# Patient Record
Sex: Female | Born: 1965 | Race: White | Hispanic: No | Marital: Married | Smoking: Never smoker
Health system: Southern US, Community
[De-identification: ages and names within clinical notes are randomized; demographics above are authoritative.]

## PROBLEM LIST (undated history)

## (undated) DIAGNOSIS — E119 Type 2 diabetes mellitus without complications: Secondary | ICD-10-CM

## (undated) DIAGNOSIS — D649 Anemia, unspecified: Secondary | ICD-10-CM

## (undated) DIAGNOSIS — T4145XA Adverse effect of unspecified anesthetic, initial encounter: Secondary | ICD-10-CM

## (undated) DIAGNOSIS — E785 Hyperlipidemia, unspecified: Secondary | ICD-10-CM

## (undated) DIAGNOSIS — C4491 Basal cell carcinoma of skin, unspecified: Secondary | ICD-10-CM

## (undated) DIAGNOSIS — K219 Gastro-esophageal reflux disease without esophagitis: Secondary | ICD-10-CM

## (undated) DIAGNOSIS — T8859XA Other complications of anesthesia, initial encounter: Secondary | ICD-10-CM

## (undated) HISTORY — DX: Hyperlipidemia, unspecified: E78.5

## (undated) HISTORY — PX: ROTATOR CUFF REPAIR: SHX139

## (undated) HISTORY — PX: CHOLECYSTECTOMY: SHX55

## (undated) HISTORY — DX: Type 2 diabetes mellitus without complications: E11.9

## (undated) HISTORY — DX: Basal cell carcinoma of skin, unspecified: C44.91

---

## 1999-03-16 ENCOUNTER — Other Ambulatory Visit: Admission: RE | Admit: 1999-03-16 | Discharge: 1999-03-16 | Payer: Self-pay | Admitting: Surgery

## 2001-01-16 ENCOUNTER — Other Ambulatory Visit: Admission: RE | Admit: 2001-01-16 | Discharge: 2001-01-16 | Payer: Self-pay | Admitting: *Deleted

## 2001-01-16 ENCOUNTER — Encounter (INDEPENDENT_AMBULATORY_CARE_PROVIDER_SITE_OTHER): Payer: Self-pay | Admitting: Specialist

## 2001-01-20 ENCOUNTER — Ambulatory Visit (HOSPITAL_COMMUNITY): Admission: RE | Admit: 2001-01-20 | Discharge: 2001-01-20 | Payer: Self-pay | Admitting: *Deleted

## 2001-01-20 ENCOUNTER — Inpatient Hospital Stay (HOSPITAL_COMMUNITY): Admission: EM | Admit: 2001-01-20 | Discharge: 2001-01-23 | Payer: Self-pay | Admitting: Internal Medicine

## 2001-01-21 ENCOUNTER — Encounter: Payer: Self-pay | Admitting: *Deleted

## 2001-02-20 ENCOUNTER — Ambulatory Visit (HOSPITAL_COMMUNITY): Admission: RE | Admit: 2001-02-20 | Discharge: 2001-02-20 | Payer: Self-pay | Admitting: *Deleted

## 2001-04-14 ENCOUNTER — Encounter: Payer: Self-pay | Admitting: Internal Medicine

## 2001-04-14 ENCOUNTER — Encounter: Admission: RE | Admit: 2001-04-14 | Discharge: 2001-04-14 | Payer: Self-pay | Admitting: Internal Medicine

## 2001-06-08 ENCOUNTER — Other Ambulatory Visit: Admission: RE | Admit: 2001-06-08 | Discharge: 2001-06-08 | Payer: Self-pay

## 2001-08-28 ENCOUNTER — Encounter: Admission: RE | Admit: 2001-08-28 | Discharge: 2001-08-28 | Payer: Self-pay

## 2001-09-25 ENCOUNTER — Other Ambulatory Visit: Admission: RE | Admit: 2001-09-25 | Discharge: 2001-09-25 | Payer: Self-pay | Admitting: Surgery

## 2002-04-09 ENCOUNTER — Encounter: Payer: Self-pay | Admitting: *Deleted

## 2002-04-09 ENCOUNTER — Encounter: Admission: RE | Admit: 2002-04-09 | Discharge: 2002-04-09 | Payer: Self-pay | Admitting: *Deleted

## 2002-11-15 ENCOUNTER — Other Ambulatory Visit: Admission: RE | Admit: 2002-11-15 | Discharge: 2002-11-15 | Payer: Self-pay | Admitting: Obstetrics and Gynecology

## 2003-11-29 ENCOUNTER — Other Ambulatory Visit: Admission: RE | Admit: 2003-11-29 | Discharge: 2003-11-29 | Payer: Self-pay | Admitting: Obstetrics and Gynecology

## 2004-12-10 ENCOUNTER — Other Ambulatory Visit: Admission: RE | Admit: 2004-12-10 | Discharge: 2004-12-10 | Payer: Self-pay | Admitting: Obstetrics and Gynecology

## 2005-01-15 ENCOUNTER — Ambulatory Visit: Payer: Self-pay | Admitting: Pulmonary Disease

## 2005-02-11 ENCOUNTER — Ambulatory Visit (HOSPITAL_BASED_OUTPATIENT_CLINIC_OR_DEPARTMENT_OTHER): Admission: RE | Admit: 2005-02-11 | Discharge: 2005-02-11 | Payer: Self-pay | Admitting: Pulmonary Disease

## 2005-02-20 ENCOUNTER — Encounter: Payer: Self-pay | Admitting: Gastroenterology

## 2005-02-21 ENCOUNTER — Ambulatory Visit: Payer: Self-pay | Admitting: Pulmonary Disease

## 2005-02-28 ENCOUNTER — Ambulatory Visit: Payer: Self-pay | Admitting: Pulmonary Disease

## 2005-03-15 ENCOUNTER — Other Ambulatory Visit: Admission: RE | Admit: 2005-03-15 | Discharge: 2005-03-15 | Payer: Self-pay | Admitting: Obstetrics and Gynecology

## 2008-04-15 ENCOUNTER — Encounter: Payer: Self-pay | Admitting: Gastroenterology

## 2008-11-23 ENCOUNTER — Ambulatory Visit (HOSPITAL_COMMUNITY): Admission: RE | Admit: 2008-11-23 | Discharge: 2008-11-23 | Payer: Self-pay | Admitting: Otolaryngology

## 2009-05-02 ENCOUNTER — Telehealth: Payer: Self-pay | Admitting: Gastroenterology

## 2009-05-03 ENCOUNTER — Ambulatory Visit: Payer: Self-pay | Admitting: Gastroenterology

## 2009-05-03 DIAGNOSIS — K219 Gastro-esophageal reflux disease without esophagitis: Secondary | ICD-10-CM

## 2009-05-03 DIAGNOSIS — R1310 Dysphagia, unspecified: Secondary | ICD-10-CM

## 2009-05-05 ENCOUNTER — Ambulatory Visit: Payer: Self-pay | Admitting: Gastroenterology

## 2009-06-19 ENCOUNTER — Ambulatory Visit: Payer: Self-pay | Admitting: Gastroenterology

## 2010-11-21 LAB — CBC
HCT: 38.5 % (ref 36.0–46.0)
MCHC: 35.3 g/dL (ref 30.0–36.0)
MCV: 87.5 fL (ref 78.0–100.0)
Platelets: 365 10*3/uL (ref 150–400)
WBC: 8.6 10*3/uL (ref 4.0–10.5)

## 2010-12-25 NOTE — Op Note (Signed)
[name], [name]             ACCOUNT NO.:  [id number]   MEDICAL RECORD NO.:  [id number]          PATIENT TYPE:  AMB   LOCATION:  SDS                          FACILITY:  [facility]   PHYSICIAN:  Onalee Hua L. Annalee Genta, M.D.DATE OF BIRTH:  Sep 29, 1965   DATE OF PROCEDURE:  11/23/2008  DATE OF DISCHARGE:  11/23/2008                               OPERATIVE REPORT   PREOPERATIVE DIAGNOSES:  1. Nasal airway obstruction.  2. Deviated nasal septum.  3. Inferior turbinate hypertrophy.  4. History of anesthesia complications.   POSTOPERATIVE DIAGNOSES:  1. Nasal airway obstruction.  2. Deviated nasal septum.  3. Inferior turbinate hypertrophy.  4. History of anesthesia complications.   INDICATIONS FOR SURGERY:  1. Nasal airway obstruction.  2. Deviated nasal septum.  3. Inferior turbinate hypertrophy.  4. History of anesthesia complications.   SURGICAL PROCEDURE:  1. Nasal septoplasty.  2. Bilateral inferior turbinate reduction.   ANESTHESIA:  General endotracheal.   COMPLICATIONS:  None.   BLOOD LOSS:  Less than 50 mL.   The patient transferred from the operating room to recovery room in  stable condition.   BRIEF HISTORY:  The patient is a 45 year old white female who is  referred to our office for evaluation of progressive symptoms of nasal  airway obstruction.  She had undergone prior sleep study which showed no  evidence of sleep apnea.  The patient complained of chronic and  progressive nasal airway congestion and difficulty with nighttime and  nasal airway breathing.  Given her history and physical examination  which showed severely deviated septum with septal spurring and turbinate  hypertrophy and failure to respond to appropriate medical therapy, I  recommended the above surgical procedures.  The patient has had a  history of anesthesia wake-up difficulties when she underwent a  cholecystectomy several years ago.  Based on her history and  examination, I recommended  that we perform the surgery at [facility] OR with possible overnight observation if necessary.  The  risks, benefits, and possible complications of surgical procedure were  discussed in detail with the patient who understood and concurred with  our plan for surgery which is scheduled at [facility] on November 23, 2008.   PROCEDURE:  The patient was brought to the operating room at [facility] on November 23, 2008 and placed in supine position on the  operating table.  General endotracheal anesthesia was established  without difficulty.  When the patient was adequately anesthetized, she  was positioned on the operating table and prepped and draped in sterile  fashion.  She was injected with a total of 7 mL of 1% lidocaine with  1:100,000 dilution epinephrine was injected in submucosal fashion along  the nasal septum and inferior turbinates bilaterally.  The patient's  nose was then packed with Afrin-soaked cottonoid pledgets which were  left in place for approximately 10 minutes for vasoconstriction and  hemostasis.  The patient was positioned and prepped and draped and the  procedure was begun.   A right anterior hemitransfixion incision was created.  A  mucoperichondrial flap was elevated  from anterior to posterior on the  right hand side.  The bony cartilaginous junction was crossed in the  midline and mucoperiosteal flap was elevated on the left.  Deviated bone  and cartilage in the mid and posterior aspect of the nasal septum were  then mobilized.  Mid septal cartilage was removed and at the conclusion  of the procedure, it was morselized and returned to the  mucoperichondrial pocket.  Anterior columellar and dorsal cartilage was  not significantly deviated and this was left intact.  The patient had a  very large inferior septal spur with bony lateralization into the left  nasal passageway.  The overlying mucosa was elevated and preserved and  bone was  resected using a 4-mm osteotome.  The patient's nasal septum  was brought to the midline.  The morselized cartilage was returned to  the mucoperichondrial pocket and the mucosal flaps were reapproximated  with a 4-0 gut suture on a Keith needle in horizontal mattress fashion.  Hemitransfixion incision was closed with the same stitch and bilateral  Doyle nasal septal splints were then placed after the application of  Bactroban ointment and these were sutured in position with a 3-0 Ethilon  suture.   Attention was then turned to the patient's inferior turbinates where  bilateral inferior turbinate reduction was performed with cautery set at  12 watts.  Two submucosal passes were made in each inferior turbinate  and when the turbinates had been adequately cauterized, they were then  outfractured.  Small anterior incisions were created in each inferior  turbinate, overlying mucosa was elevated, and a small amount of  turbinate bone was resected.  This allowed better lateralization of the  turbinates and a widely patent nasal cavity.   Nasal cavity was irrigated and suctioned.  Oral cavity was suctioned.  Orogastric tube was passed.  Stomach contents were aspirated.  The  patient was then awakened from anesthetic.  She was extubated and was  transferred from the operating room to the recovery room in stable  condition.  There were no complications, and blood loss was less than 50  mL.     ______________________________  Kinnie Scales. Annalee Genta, M.D.    ______________________________  Kinnie Scales. Annalee Genta, M.D.    DLS/MEDQ  D:  04/54/0981  T:  11/24/2008  Job:  [id number]

## 2010-12-28 NOTE — Discharge Summary (Signed)
[facility]  Patient:    [name], [name]                      MRN: [id number] Adm. Date:  [id number] Disc. Date: [id number] Attending:  Vikki Ports CC:         Roosvelt Harps, M.D.             Julieanne Manson, M.D.                           Discharge Summary  ADMISSION DIAGNOSES:  Abdominal pain status post laparoscopic cholecystectomy.  DISCHARGE DIAGNOSES:  Abdominal pain status post laparoscopic cholecystectomy.  CONDITION ON DISCHARGE:  Good and improved.  CONSULTING PHYSICIAN:  Roosvelt Harps, M.D.  HISTORY OF PRESENT ILLNESS:  The patient is a 45 year old white female status post laparoscopic cholecystectomy.  Was seen in the office with abdominal pain and low grade fever.  Patient underwent laboratory tests as an outpatient which showed a markedly elevated bilirubin.  Patient was brought over to the hospital and prepared for admission.  HOSPITAL COURSE:  Of note, repeat laboratories showed her bilirubin to be within normal limits.  SGOT was 100.  She underwent hepatobiliary scan which was normal.  On hospital day #2 patient continued to have some abdominal pain and nausea and therefore underwent ERCP which showed no evidence of leak or common bile duct stones.  A small stent was left in place.  Patient continued to have increasing nausea over the next 24 hours and some mild periumbilical pain.  She denied any diarrhea.  Temperature remained in the 99 range, however, by hospital day #2 patient was feeling more better, pain was completely resolved.  She was no longer nauseous.  She was advanced to a regular diet which she tolerated well and was discharged later on that afternoon.  DISCHARGE MEDICATIONS:  Vicodin for pain.  FOLLOW-UP:  Two weeks with me in the office. DD:  02/10/01 TD:  02/10/01 Job: [id number]

## 2010-12-28 NOTE — Consult Note (Signed)
[name]  Patient:    [name], [name]                      MRN: [id number] Proc. Date: 01/20/01 Adm. Date:  [id number] Attending:  Vikki Ports CC:         [name], M.D.  Darius Bump, M.D.   Consultation Report  HISTORY OF PRESENT ILLNESS:  [name] is a 45 year old female, whom I am asked to see for abnormal liver function tests and right upper quadrant pain, status post a cholecystectomy four days ago.  She reportedly had gallstones and an uneventful cholecystectomy, although she had difficulty taking a deep breath and right upper quadrant discomfort postoperatively, which did not resolve and in fact has gotten worse.  She also reports that she has been running fevers.  She was seen in the office today by Dr. Luan Pulling. Hemoglobin was 12, white blood count 6.9.  Laboratory tests of liver function were entered erroneously, and initially her total bilirubin was reported to be 8.1, but this was a mistake.  It was the total protein that was 8.1. Reevaluation of her liver tests revealed that her total bilirubin is 1.5.  Her SGOT is 134, SGPT 273.  Preoperatively reportedly her liver function tests were normal.  PAST MEDICAL HISTORY:  Largely noncontributory.  ALLERGIES:  PENICILLIN.  PHYSICAL EXAMINATION:  GENERAL:  She is a well-developed, mildly overweight adult female in mild distress.  VITAL SIGNS:  Temperature is 100.9, blood pressure 117/86, pulse 116, respiratory rate 20.  HEENT:  Eyes are anicteric.  Oropharynx is unremarkable.  CHEST:  Clear.  HEART:  Heart sounds regular rate and rhythm without murmurs.  ABDOMEN:  Soft with hypoactive bowel sounds.  The laparoscopic incision sites are clean and nontender.  There is tenderness in the right upper quadrant to deep palpation.  EXTREMITIES:  Without cyanosis, clubbing, edema, or rash.  IMPRESSION:  A 45 year old female with abnormal liver function  tests and fever and right upper quadrant pain, status post cholecystectomy.  HIDA scan today was reportedly normal.  The most likely diagnoses are:  A biliary leak, cholangitis, or retained common bile duct stone, possibly with mild cholangitis.  PLAN:  The patient will be admitted on the surgical service, hydrated, and given antibiotics and probable ERCT will be performed tomorrow afternoon. Please see the orders. DD:  01/20/01 TD:  01/20/01 Job: [id number]

## 2010-12-28 NOTE — Procedures (Signed)
[name], [name]             ACCOUNT NO.:  [id number]   MEDICAL RECORD NO.:  [id number]          PATIENT TYPE:  OUT   LOCATION:  SLEEP CENTER                 FACILITY:  [facility]   PHYSICIAN:  Marcelyn Bruins, M.D. [facility] DATE OF BIRTH:  [birth year]   DATE OF STUDY:  02/11/2005                              NOCTURNAL POLYSOMNOGRAM   REFERRING PHYSICIAN:  Dr. Marcelyn Bruins.   INDICATION FOR THE STUDY:  Hypersomnia with sleep apnea. Epworth score: 16.   SLEEP ARCHITECTURE:  The patient has a total sleep time of 413 minutes with  greatly decreased REM but adequate slow wave sleep. Sleep onset latency was  mildly prolonged at 38 minutes as was REM onset at 207 minutes.   IMPRESSION:  1.  Small numbers of obstructive events which do not meet the respiratory      disturbance index criteria for the obstructive sleep apnea syndrome.      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 Clinical correlation is suggested.  2.  No clinically significant cardiac arrhythmias.  3.  Large numbers of leg jerks with significant sleep disruption. I suspect      this is the main contributor to the patient's sleep disruption.     ______________________________  Suzzette Righter    KC/MEDQ  D:  02/21/2005 10:35:26  T:  02/21/2005 11:30:14  Job:  [id number]

## 2011-08-15 ENCOUNTER — Emergency Department (HOSPITAL_BASED_OUTPATIENT_CLINIC_OR_DEPARTMENT_OTHER)
Admission: EM | Admit: 2011-08-15 | Discharge: 2011-08-15 | Disposition: A | Discharge status: Home / Self Care | Payer: 59 | Attending: Emergency Medicine | Admitting: Emergency Medicine

## 2011-08-15 ENCOUNTER — Encounter: Payer: Self-pay | Admitting: *Deleted

## 2011-08-15 ENCOUNTER — Emergency Department (INDEPENDENT_AMBULATORY_CARE_PROVIDER_SITE_OTHER): Payer: 59

## 2011-08-15 DIAGNOSIS — R1031 Right lower quadrant pain: Secondary | ICD-10-CM | POA: Insufficient documentation

## 2011-08-15 DIAGNOSIS — K219 Gastro-esophageal reflux disease without esophagitis: Secondary | ICD-10-CM | POA: Insufficient documentation

## 2011-08-15 DIAGNOSIS — Z79899 Other long term (current) drug therapy: Secondary | ICD-10-CM | POA: Insufficient documentation

## 2011-08-15 DIAGNOSIS — J45909 Unspecified asthma, uncomplicated: Secondary | ICD-10-CM | POA: Insufficient documentation

## 2011-08-15 HISTORY — DX: Gastro-esophageal reflux disease without esophagitis: K21.9

## 2011-08-15 LAB — PREGNANCY, URINE: Preg Test, Ur: NEGATIVE

## 2011-08-15 LAB — CBC
HCT: 37.8 % (ref 36.0–46.0)
Hemoglobin: 12.9 g/dL (ref 12.0–15.0)
MCV: 85.1 fL (ref 78.0–100.0)
Platelets: 325 10*3/uL (ref 150–400)
RBC: 4.44 MIL/uL (ref 3.87–5.11)
WBC: 10.2 10*3/uL (ref 4.0–10.5)

## 2011-08-15 LAB — BASIC METABOLIC PANEL
CO2: 27 mEq/L (ref 19–32)
Calcium: 9.6 mg/dL (ref 8.4–10.5)
Glucose, Bld: 95 mg/dL (ref 70–99)
Sodium: 138 mEq/L (ref 135–145)

## 2011-08-15 LAB — URINALYSIS, ROUTINE W REFLEX MICROSCOPIC
Glucose, UA: NEGATIVE mg/dL
Hgb urine dipstick: NEGATIVE
Specific Gravity, Urine: 1.022 (ref 1.005–1.030)
Urobilinogen, UA: 0.2 mg/dL (ref 0.0–1.0)
pH: 6.5 (ref 5.0–8.0)

## 2011-08-15 LAB — HEPATIC FUNCTION PANEL
AST: 18 U/L (ref 0–37)
Albumin: 4.6 g/dL (ref 3.5–5.2)
Alkaline Phosphatase: 56 U/L (ref 39–117)
Total Bilirubin: 0.7 mg/dL (ref 0.3–1.2)

## 2011-08-15 LAB — DIFFERENTIAL
Eosinophils Relative: 1 % (ref 0–5)
Lymphocytes Relative: 20 % (ref 12–46)
Lymphs Abs: 2 10*3/uL (ref 0.7–4.0)

## 2011-08-15 MED ORDER — ONDANSETRON HCL 4 MG/2ML IJ SOLN
4.0000 mg | Freq: Once | INTRAMUSCULAR | Status: AC
  Administered 2011-08-15: 4 mg via INTRAVENOUS
  Filled 2011-08-15: qty 2

## 2011-08-15 MED ORDER — SODIUM CHLORIDE 0.9 % IV BOLUS (SEPSIS)
1000.0000 mL | Freq: Once | INTRAVENOUS | Status: AC
  Administered 2011-08-15: 1000 mL via INTRAVENOUS

## 2011-08-15 MED ORDER — IOHEXOL 300 MG/ML  SOLN
100.0000 mL | Freq: Once | INTRAMUSCULAR | Status: AC | PRN
  Administered 2011-08-15: 100 mL via INTRAVENOUS

## 2011-08-15 NOTE — ED Notes (Signed)
PT AMB TO TRIAGE WITH QUICK STEADY GAIT IN NAD, REPORTS AWAKENING AT 3AM WITH RLQ PAIN, WORSENING THROUGHOUT THE DAY WITH NAUSEA, NO EMESIS OR DIARRHEA, OR FEVERS.

## 2011-08-15 NOTE — ED Notes (Signed)
Pt ambulated to CT

## 2011-08-15 NOTE — ED Provider Notes (Signed)
History     CSN: [id number]  Arrival date & time 08/15/11  1253   First MD Initiated Contact with Patient 08/15/11 1506      Chief Complaint  Patient presents with  . Abdominal Pain    (Consider location/radiation/quality/duration/timing/severity/associated sxs/prior treatment) HPI  45yof chief asthma, GERD presents with abdominal pain. The patient complains of waking up this morning at 3 AM with right lower quadrant pain. She rates as a 5/10 at this time without radiation. The pain is constant and dull but intermittently worsening and sharp. She states it is gradually increased in intensity throughout the day. She complains of nausea but no vomiting. Denies back pain. Denies diarrhea. Denies hematuria/dysuria/freq/urgency. History of cholecystectomy, no other abdominal surgeries.   ED Notes, ED Provider Notes from 08/15/11 0000 to 08/15/11 13:57:25       Amy Theotis Barrio, RN 08/15/2011 13:53      PT AMB TO TRIAGE WITH QUICK STEADY GAIT IN NAD, REPORTS AWAKENING AT 3AM WITH RLQ PAIN, WORSENING THROUGHOUT THE DAY WITH NAUSEA, NO EMESIS OR DIARRHEA, OR FEVERS.    Past Medical History  Diagnosis Date  . Asthma   . GERD (gastroesophageal reflux disease)     History reviewed. No pertinent past surgical history.  History reviewed. No pertinent family history.  History  Substance Use Topics  . Smoking status: Never Smoker   . Smokeless tobacco: Not on file  . Alcohol Use: No    OB History    Grav Para Term Preterm Abortions TAB SAB Ect Mult Living                  Review of Systems  All other systems reviewed and are negative.   except as noted HPI  Allergies  Penicillins  Home Medications   Current Outpatient Rx  Name Route Sig Dispense Refill  . ALBUTEROL SULFATE HFA 108 (90 BASE) MCG/ACT IN AERS Inhalation Inhale 2 puffs into the lungs every 6 (six) hours as needed.      Marland Kitchen FEXOFENADINE HCL 180 MG PO TABS Oral Take 180 mg by mouth daily.      Marland Kitchen FLUTICASONE-SALMETEROL  115-21 MCG/ACT IN AERO Inhalation Inhale 2 puffs into the lungs 2 (two) times daily.      Marland Kitchen OMEPRAZOLE-SODIUM BICARBONATE 40-1100 MG PO CAPS Oral Take 1 capsule by mouth daily before breakfast.        BP 156/90  Pulse 98  Temp(Src) 98.2 F (36.8 C) (Oral)  Resp 18  Ht 5\' 3"  (1.6 m)  Wt 183 lb (83.008 kg)  BMI 32.42 kg/m2  SpO2 100%  LMP 07/29/2011  Physical Exam  Nursing note and vitals reviewed. Constitutional: She is oriented to person, place, and time. She appears well-developed.  HENT:  Head: Atraumatic.  Mouth/Throat: Oropharynx is clear and moist.  Eyes: Conjunctivae and EOM are normal. Pupils are equal, round, and reactive to light.  Neck: Normal range of motion. Neck supple.  Cardiovascular: Normal rate, regular rhythm, normal heart sounds and intact distal pulses.   Pulmonary/Chest: Effort normal and breath sounds normal. No respiratory distress. She has no wheezes. She has no rales.  Abdominal: Soft. She exhibits no distension. There is tenderness. There is no rebound and no guarding.       +RLQ ttp no r/g  Genitourinary:       Bimanual- no CMT  Musculoskeletal: Normal range of motion.  Neurological: She is alert and oriented to person, place, and time.  Skin: Skin is warm and dry.  No rash noted.  Psychiatric: She has a normal mood and affect.    ED Course  Procedures (including critical care time)  Labs Reviewed  URINALYSIS, ROUTINE W REFLEX MICROSCOPIC - Abnormal; Notable for the following:    APPearance CLOUDY (*)    All other components within normal limits  CBC  DIFFERENTIAL  BASIC METABOLIC PANEL  HEPATIC FUNCTION PANEL  PREGNANCY, URINE   Ct Abdomen Pelvis W Contrast  08/15/2011  *RADIOLOGY REPORT*  Clinical Data: Pain.  History of cholecystectomy.  CT ABDOMEN AND PELVIS WITH CONTRAST  Technique:  Multidetector CT imaging of the abdomen and pelvis was performed following the standard protocol during bolus administration of intravenous contrast.   Contrast: OMNIPAQUE IOHEXOL 300 MG/ML IV SOLN  Comparison: None.  Findings: The appendix is well visualized and has a normal appearance without evidence of inflammation or enlargement.  The liver shows mild, diffuse fatty infiltration.  The gallbladder has been removed.  Bile ducts are nondilated.  The pancreas, spleen, bowel, adrenal glands and kidneys are within normal limits.  The bladder is unremarkable.  2.5 cm cyst in the left adnexal region is likely physiologic.  No free fluid or abscess.  No hernias identified.  No abnormal calcifications.  No masses or enlarged lymph nodes identified.  Bony structures are unremarkable.  IMPRESSION: No acute findings.  Normal appendix.  Original Report Authenticated By: Reola Calkins, M.D.     1. Abdominal pain     MDM  Since with right lower quadrant abdominal pain. Patient appears well here. The pain as 5/10 she is declining analgesia at this time. IV fluids, Zofran, labs and urinalysis. CT abdomen and pelvis. Reassess.   CT AP as above negative for appendicitis, colitis or other acute intraabdominal etiology for patient's pain. Labs unremarkable. Will d/c home with PMD f/u. Precautions for return.     Forbes Cellar, MD 08/15/11 1701

## 2012-03-19 ENCOUNTER — Other Ambulatory Visit: Payer: Self-pay | Admitting: Gastroenterology

## 2012-03-20 ENCOUNTER — Other Ambulatory Visit: Payer: Self-pay | Admitting: Gastroenterology

## 2012-03-23 ENCOUNTER — Other Ambulatory Visit: Payer: Self-pay | Admitting: Gastroenterology

## 2012-03-23 MED ORDER — OMEPRAZOLE-SODIUM BICARBONATE 40-1100 MG PO CAPS: 1.0000 | ORAL_CAPSULE | Freq: Every day | ORAL | Status: DC

## 2012-03-23 NOTE — Telephone Encounter (Signed)
[facility]. Patient scheduled an appointment today for September

## 2012-04-08 IMAGING — CT CT ABD-PELV W/ CM
2 of 4 series · 16 of 46 positions shown, 18 images · IV contrast (APPLIED)
Comparison: None.

CLINICAL DATA: Pain.  History of cholecystectomy.

CT ABDOMEN AND PELVIS WITH CONTRAST
TECHNIQUE: Multidetector CT imaging of the abdomen and pelvis was
performed following the standard protocol during bolus
administration of intravenous contrast.
Contrast: 100mL OMNIPAQUE IOHEXOL 300 MG/ML IV SOLN

[Series 2: abd/pelvis 5.0 b31f · axial · 0.74mm/px · z∈[+756,+1146]mm · 13 of 86 slices shown, 15 images]
[im 4/86  soft-tissue]
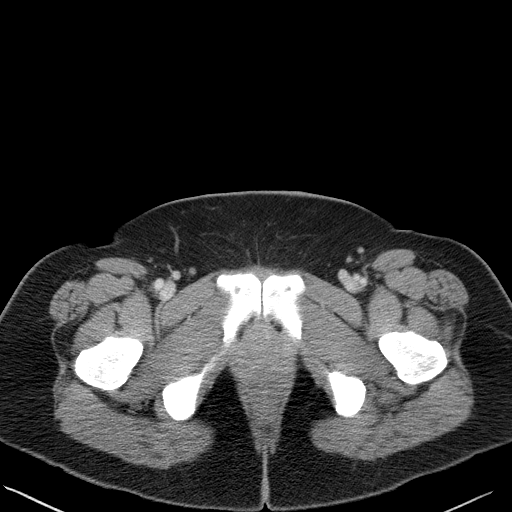
[im 4/86  bone]
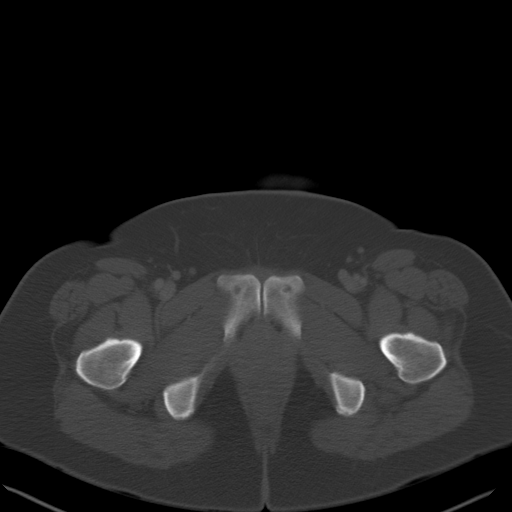
[im 12/86  soft-tissue]
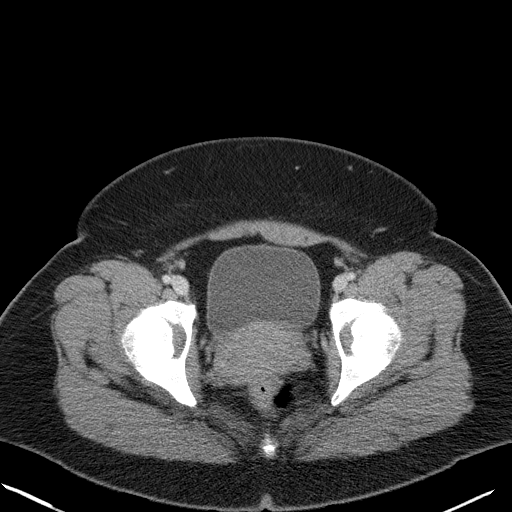
[im 19/86  soft-tissue]
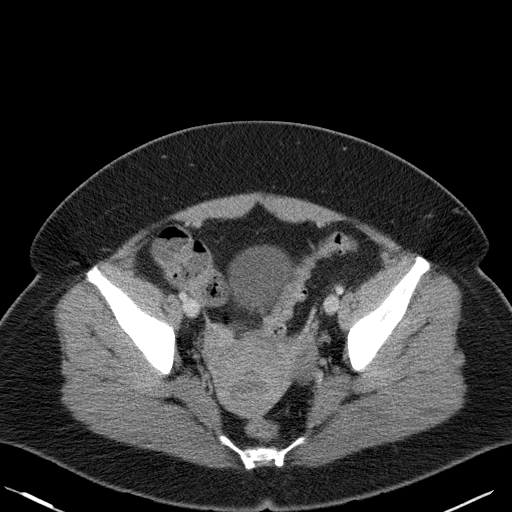
[im 23/86  soft-tissue]
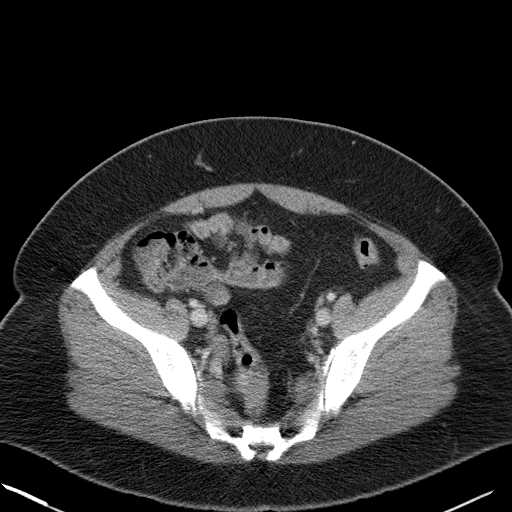
[im 30/86  soft-tissue]
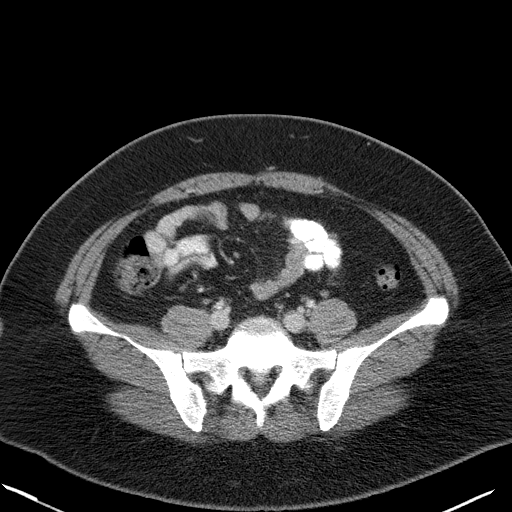
[im 37/86  soft-tissue]
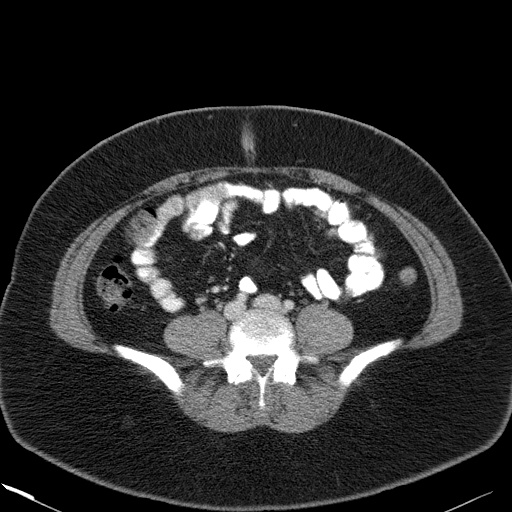
[im 45/86  soft-tissue]
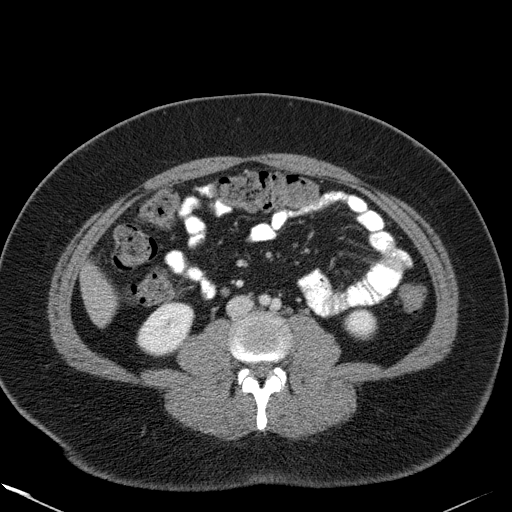
[im 49/86  soft-tissue]
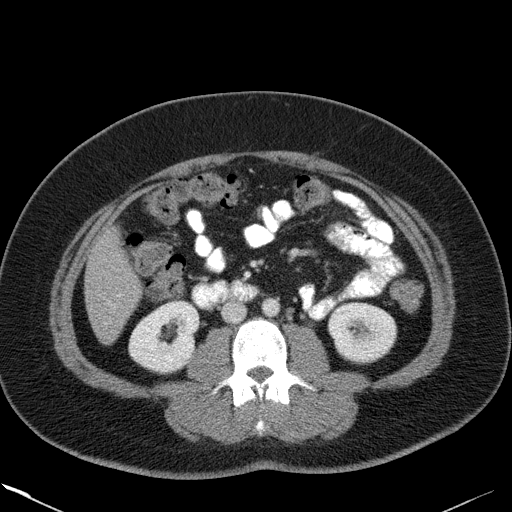
[im 56/86  soft-tissue]
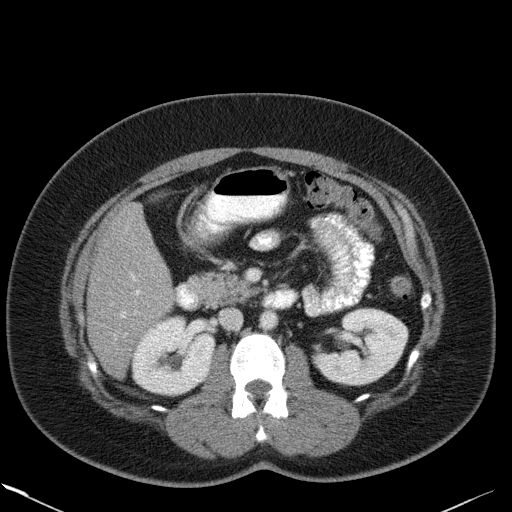
[im 56/86  bone]
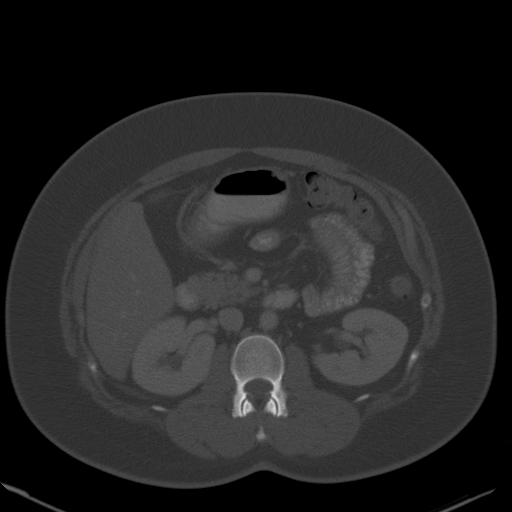
[im 63/86  soft-tissue]
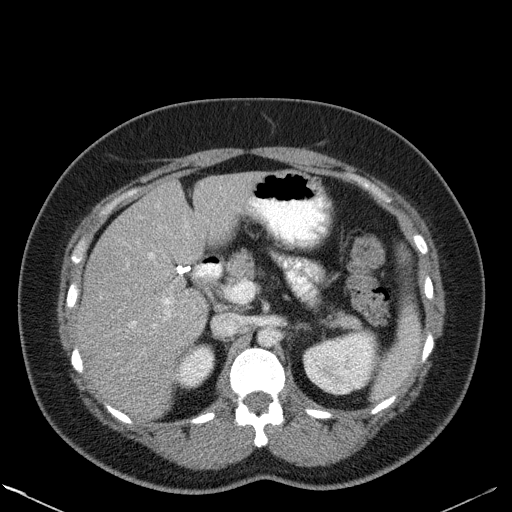
[im 67/86  soft-tissue]
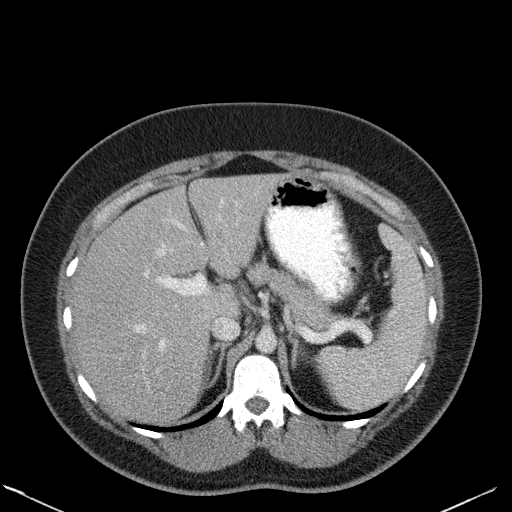
[im 74/86  soft-tissue]
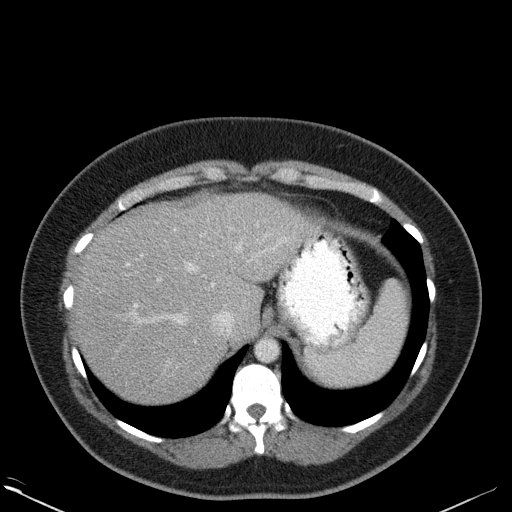
[im 82/86  soft-tissue]
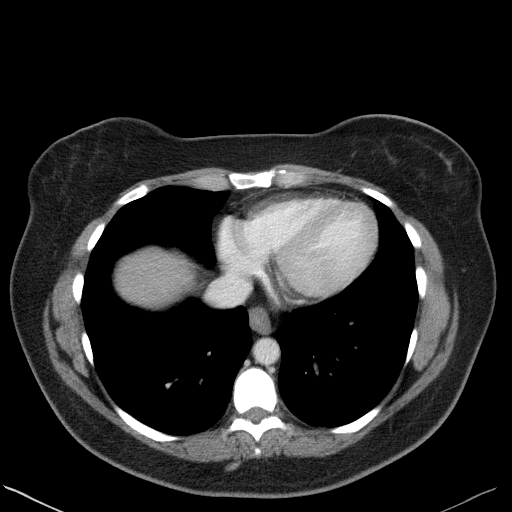

[Series 5: abd/pelvis 3.0 coronal · coronal · 0.66mm/px · 3 of 98 slices shown]
[im 33/98  soft-tissue]
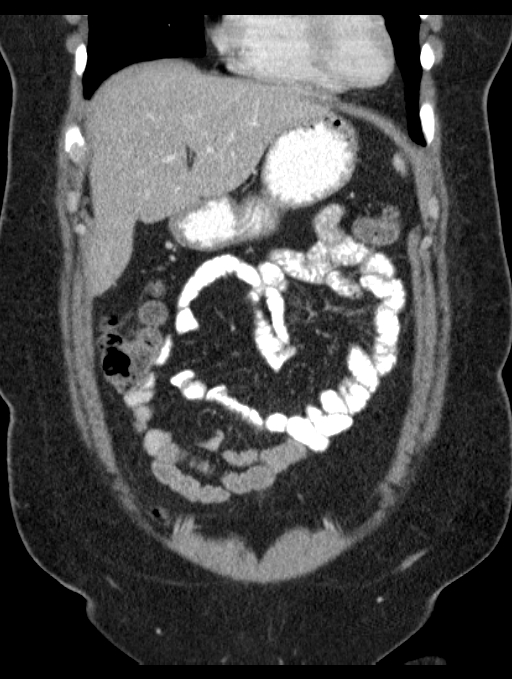
[im 44/98  soft-tissue]
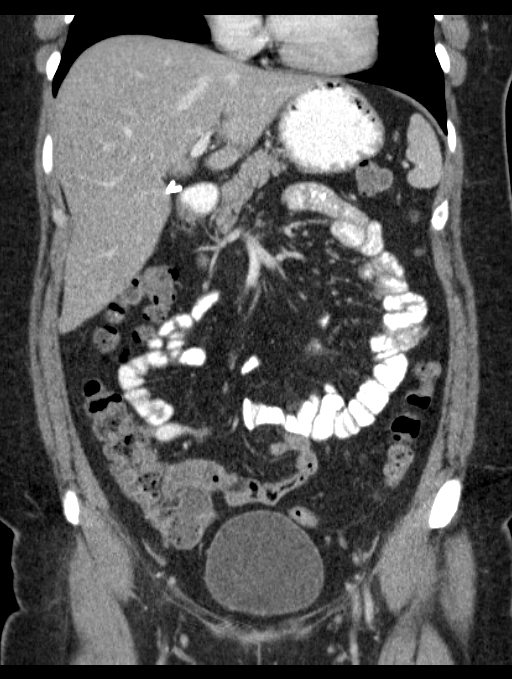
[im 54/98  soft-tissue]
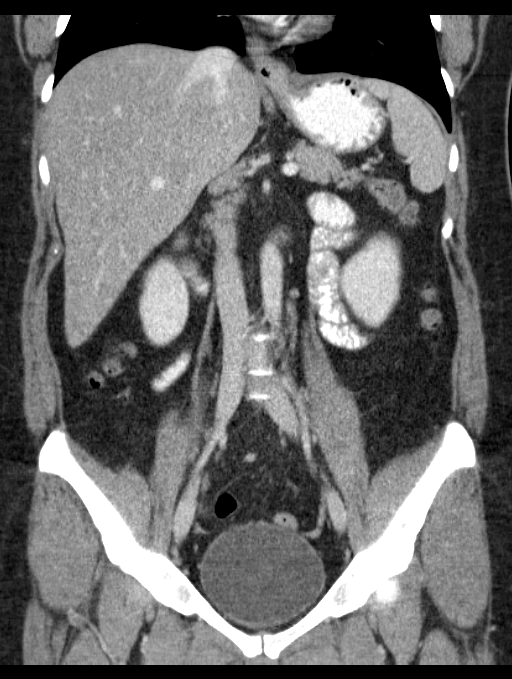

[16 of 46 positions shown; findings below may reference images not displayed]

FINDINGS: The appendix is well visualized and has a normal
appearance without evidence of inflammation or enlargement.  The
liver shows mild, diffuse fatty infiltration.  The gallbladder has
been removed.  Bile ducts are nondilated.

The pancreas, spleen, bowel, adrenal glands and kidneys are within
normal limits.  The bladder is unremarkable.  2.5 cm cyst in the
left adnexal region is likely physiologic.  No free fluid or
abscess.  No hernias identified.  No abnormal calcifications.  No
masses or enlarged lymph nodes identified.  Bony structures are
unremarkable.
IMPRESSION: No acute findings.  Normal appendix.

## 2012-05-04 ENCOUNTER — Ambulatory Visit (INDEPENDENT_AMBULATORY_CARE_PROVIDER_SITE_OTHER): Payer: 59 | Admitting: Gastroenterology

## 2012-05-04 ENCOUNTER — Encounter: Payer: Self-pay | Admitting: Gastroenterology

## 2012-05-04 VITALS — BP 130/70 | HR 70 | Ht 63.0 in | Wt 188.0 lb

## 2012-05-04 DIAGNOSIS — K219 Gastro-esophageal reflux disease without esophagitis: Secondary | ICD-10-CM

## 2012-05-04 DIAGNOSIS — R1031 Right lower quadrant pain: Secondary | ICD-10-CM

## 2012-05-04 NOTE — Progress Notes (Signed)
History of Present Illness: Pleasant 46 year old white female with history of esophageal stricture here for evaluation of abdominal pain and reflux. The past 10 months she has noted intermittent episodes of severe, sharp right and left lower quadrant pain accompanied by urgency and diarrhea. This does not occur with any specific foods that she is aware of. ER evaluation in January, 2013 included a CT that was negative. For the past 2 months, after completely discontinuing use of carbonated beverages, symptoms have entirely subsided. She is without pain, bleeding or diarrhea.  Patient also complains of regurgitation of gastric contents after  bedtime with pyrosis and sore throat. This is despite taking Zegerid at bedtime. She feels her throat is tightening up when she lays down. There has been no change in her diet. She has a history of erosive esophagitis and an esophageal stricture which was dilated in 2010.    Past Medical History  Diagnosis Date  . Asthma   . GERD (gastroesophageal reflux disease)    Past Surgical History  Procedure Date  . Cholecystectomy   . Rotator cuff repair     right   family history includes Diabetes in an unspecified family member and Lung cancer in her mother.  There is no history of Colon cancer. Current Outpatient Prescriptions  Medication Sig Dispense Refill  . albuterol (PROVENTIL HFA;VENTOLIN HFA) 108 (90 BASE) MCG/ACT inhaler Inhale 2 puffs into the lungs every 6 (six) hours as needed.        . fexofenadine (ALLEGRA) 180 MG tablet Take 180 mg by mouth daily.        . fluticasone-salmeterol (ADVAIR HFA) 115-21 MCG/ACT inhaler Inhale 2 puffs into the lungs 2 (two) times daily.        Marland Kitchen omeprazole-sodium bicarbonate (ZEGERID) 40-1100 MG per capsule Take 1 capsule by mouth daily before breakfast.  30 capsule  3   Allergies as of 05/04/2012 - Review Complete 05/04/2012  Allergen Reaction Noted  . Penicillins      reports that she has never smoked. She has  never used smokeless tobacco. She reports that she does not drink alcohol or use illicit drugs.     Review of Systems: Pertinent positive and negative review of systems were noted in the above HPI section. All other review of systems were otherwise negative.  Vital signs were reviewed in today's medical record Physical Exam: General: Well developed , well nourished, no acute distress Head: Normocephalic and atraumatic Eyes:  sclerae anicteric, EOMI Ears: Normal auditory acuity Mouth: No deformity or lesions Neck: Supple, no masses or thyromegaly Lungs: Clear throughout to auscultation Heart: Regular rate and rhythm; no murmurs, rubs or bruits Abdomen: Soft, non tender and non distended. No masses, hepatosplenomegaly or hernias noted. Normal Bowel sounds. There is no succussion splash Rectal:deferred Musculoskeletal: Symmetrical with no gross deformities  Skin: No lesions on visible extremities Pulses:  Normal pulses noted Extremities: No clubbing, cyanosis, edema or deformities noted Neurological: Alert oriented x 4, grossly nonfocal Cervical Nodes:  No significant cervical adenopathy Inguinal Nodes: No significant inguinal adenopathy Psychological:  Alert and cooperative. Normal mood and affect

## 2012-05-04 NOTE — Assessment & Plan Note (Addendum)
Reason flareup of nocturnal GERD symptoms.  Recommendations #1 continue  Zegerid atbedtime #2 add antacids at bedtime; if not improved I would consider adding metoclopramide each bedtime or possibly baclofen

## 2012-05-04 NOTE — Assessment & Plan Note (Signed)
Etiology is not certain but the patient clearly states that symptoms have entirely subsided after stopping ingestion of sodas. I doubt she has significant underlying GI pathology.  Recommendations #1 no GI workup unless symptoms recur

## 2012-05-04 NOTE — Patient Instructions (Addendum)
We will renew your Zegerid  Follow up in 1 year Gastroesophageal Reflux Disease, Adult Gastroesophageal reflux disease (GERD) happens when acid from your stomach flows up into the esophagus. When acid comes in contact with the esophagus, the acid causes soreness (inflammation) in the esophagus. Over time, GERD may create small holes (ulcers) in the lining of the esophagus. CAUSES   Increased body weight. This puts pressure on the stomach, making acid rise from the stomach into the esophagus.   Smoking. This increases acid production in the stomach.   Drinking alcohol. This causes decreased pressure in the lower esophageal sphincter (valve or ring of muscle between the esophagus and stomach), allowing acid from the stomach into the esophagus.   Late evening meals and a full stomach. This increases pressure and acid production in the stomach.   A malformed lower esophageal sphincter.  Sometimes, no cause is found. SYMPTOMS   Burning pain in the lower part of the mid-chest behind the breastbone and in the mid-stomach area. This may occur twice a week or more often.   Trouble swallowing.   Sore throat.   Dry cough.   Asthma-like symptoms including chest tightness, shortness of breath, or wheezing.  DIAGNOSIS  Your caregiver may be able to diagnose GERD based on your symptoms. In some cases, X-rays and other tests may be done to check for complications or to check the condition of your stomach and esophagus. TREATMENT  Your caregiver may recommend over-the-counter or prescription medicines to help decrease acid production. Ask your caregiver before starting or adding any new medicines.  HOME CARE INSTRUCTIONS   Change the factors that you can control. Ask your caregiver for guidance concerning weight loss, quitting smoking, and alcohol consumption.   Avoid foods and drinks that make your symptoms worse, such as:   Caffeine or alcoholic drinks.   Chocolate.   Peppermint or mint  flavorings.   Garlic and onions.   Spicy foods.   Citrus fruits, such as oranges, lemons, or limes.   Tomato-based foods such as sauce, chili, salsa, and pizza.   Fried and fatty foods.   Avoid lying down for the 3 hours prior to your bedtime or prior to taking a nap.   Eat small, frequent meals instead of large meals.   Wear loose-fitting clothing. Do not wear anything tight around your waist that causes pressure on your stomach.   Raise the head of your bed 6 to 8 inches with wood blocks to help you sleep. Extra pillows will not help.   Only take over-the-counter or prescription medicines for pain, discomfort, or fever as directed by your caregiver.   Do not take aspirin, ibuprofen, or other nonsteroidal anti-inflammatory drugs (NSAIDs).  SEEK IMMEDIATE MEDICAL CARE IF:   You have pain in your arms, neck, jaw, teeth, or back.   Your pain increases or changes in intensity or duration.   You develop nausea, vomiting, or sweating (diaphoresis).   You develop shortness of breath, or you faint.   Your vomit is green, yellow, black, or looks like coffee grounds or blood.   Your stool is red, bloody, or black.  These symptoms could be signs of other problems, such as heart disease, gastric bleeding, or esophageal bleeding. MAKE SURE YOU:   Understand these instructions.   Will watch your condition.   Will get help right away if you are not doing well or get worse.  Document Released: 05/08/2005 Document Revised: 07/18/2011 Document Reviewed: 02/15/2011 [facility] Patient Information 2012 [place],  [employer]. 

## 2012-10-07 ENCOUNTER — Other Ambulatory Visit: Payer: Self-pay | Admitting: Internal Medicine

## 2012-10-07 ENCOUNTER — Ambulatory Visit
Admission: RE | Admit: 2012-10-07 | Discharge: 2012-10-07 | Disposition: A | Discharge status: Home / Self Care | Payer: 59 | Source: Ambulatory Visit | Attending: Internal Medicine | Admitting: Internal Medicine

## 2012-10-07 DIAGNOSIS — R221 Localized swelling, mass and lump, neck: Secondary | ICD-10-CM

## 2012-10-12 ENCOUNTER — Other Ambulatory Visit: Payer: 59

## 2012-12-10 ENCOUNTER — Other Ambulatory Visit: Payer: Self-pay | Admitting: Internal Medicine

## 2012-12-10 DIAGNOSIS — E041 Nontoxic single thyroid nodule: Secondary | ICD-10-CM

## 2012-12-29 ENCOUNTER — Ambulatory Visit
Admission: RE | Admit: 2012-12-29 | Discharge: 2012-12-29 | Disposition: A | Discharge status: Home / Self Care | Payer: 59 | Source: Ambulatory Visit | Attending: Internal Medicine | Admitting: Internal Medicine

## 2012-12-29 DIAGNOSIS — E041 Nontoxic single thyroid nodule: Secondary | ICD-10-CM

## 2013-02-23 ENCOUNTER — Other Ambulatory Visit: Payer: Self-pay | Admitting: Gastroenterology

## 2013-03-30 ENCOUNTER — Other Ambulatory Visit (HOSPITAL_COMMUNITY): Payer: Self-pay | Admitting: Family Medicine

## 2013-03-30 DIAGNOSIS — E041 Nontoxic single thyroid nodule: Secondary | ICD-10-CM

## 2013-06-30 ENCOUNTER — Emergency Department (HOSPITAL_COMMUNITY): Admission: EM | Admit: 2013-06-30 | Payer: 59 | Source: Home / Self Care

## 2013-06-30 ENCOUNTER — Ambulatory Visit (HOSPITAL_COMMUNITY)
Admission: RE | Admit: 2013-06-30 | Discharge: 2013-06-30 | Disposition: A | Discharge status: Home / Self Care | Payer: 59 | Source: Ambulatory Visit | Attending: Family Medicine | Admitting: Family Medicine

## 2013-06-30 DIAGNOSIS — E041 Nontoxic single thyroid nodule: Secondary | ICD-10-CM | POA: Insufficient documentation

## 2013-09-24 ENCOUNTER — Other Ambulatory Visit: Payer: Self-pay | Admitting: Gastroenterology

## 2014-02-22 IMAGING — US US SOFT TISSUE HEAD/NECK
1 series · 14 of 25 positions shown · non-contrast
Comparison: 12/29/2012

CLINICAL DATA: Followup thyroid nodule

EXAM:
THYROID ULTRASOUND
TECHNIQUE: Ultrasound examination of the thyroid gland and adjacent soft
tissues was performed.

[Series 1: us soft tissue head/neck · 0.07mm/px · 14 of 29 slices shown]
[im 1/29]
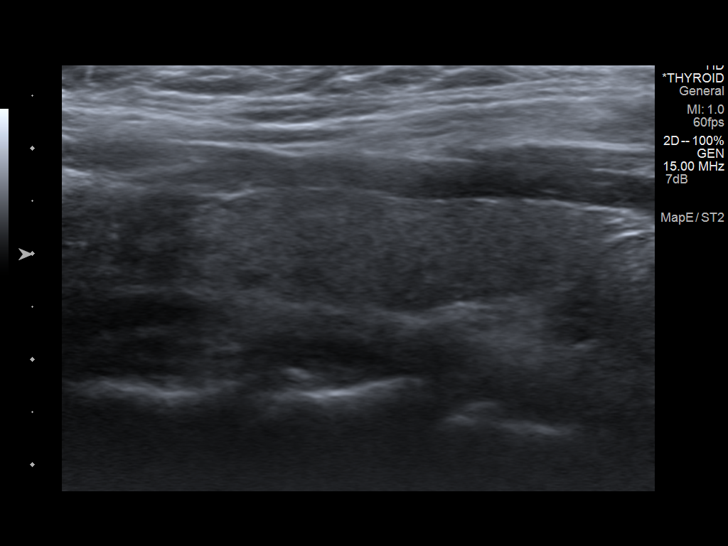
[im 3/29]
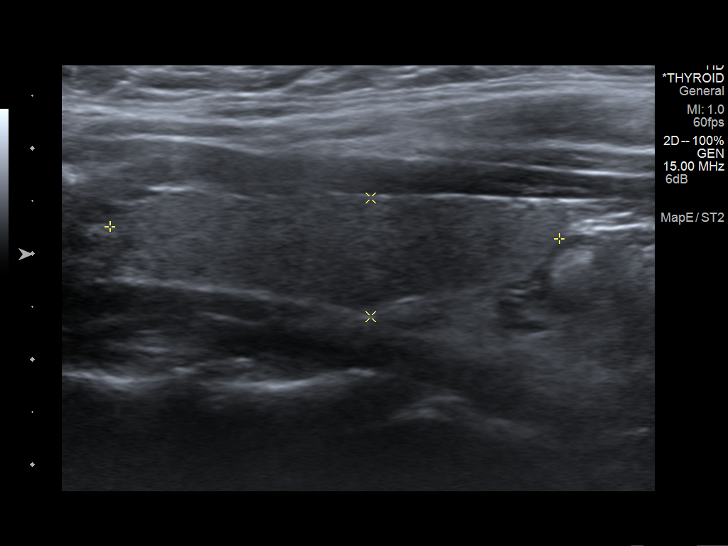
[im 5/29]
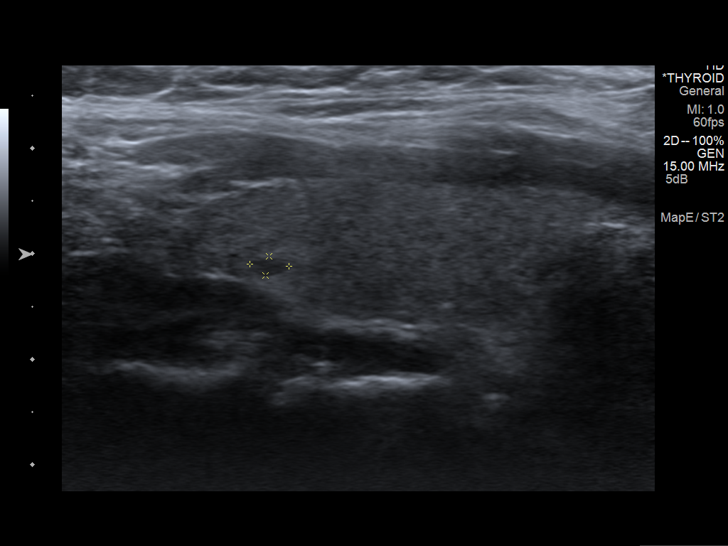
[im 8/29]
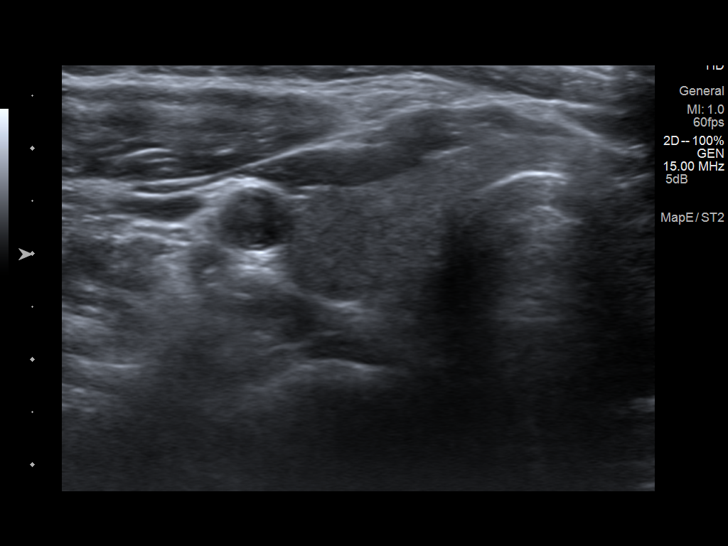
[im 10/29]
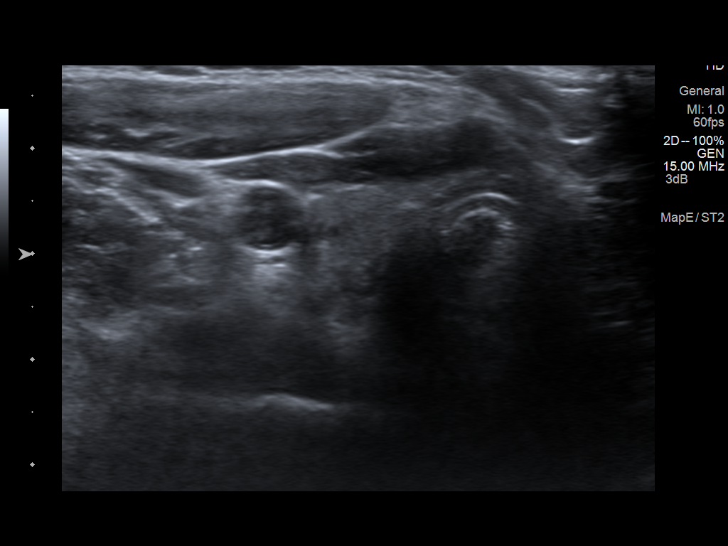
[im 11/29]
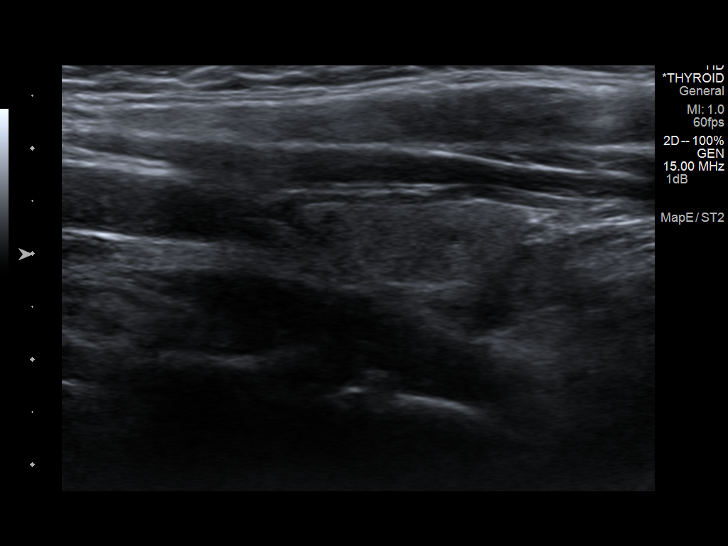
[im 13/29]
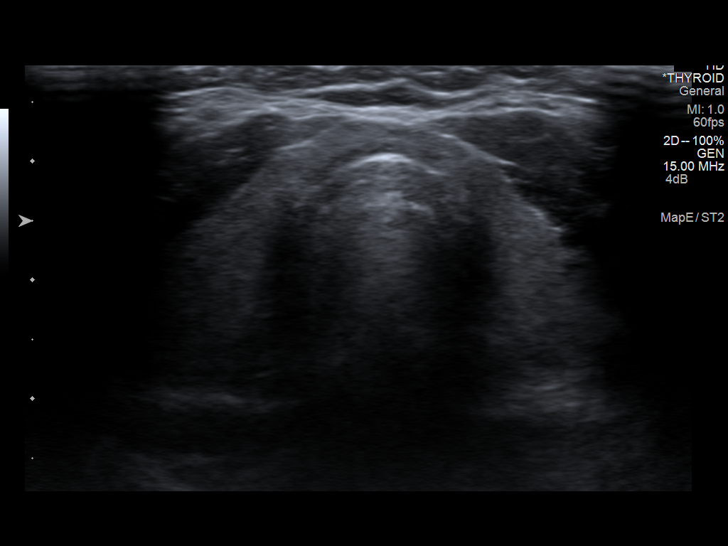
[im 16/29]
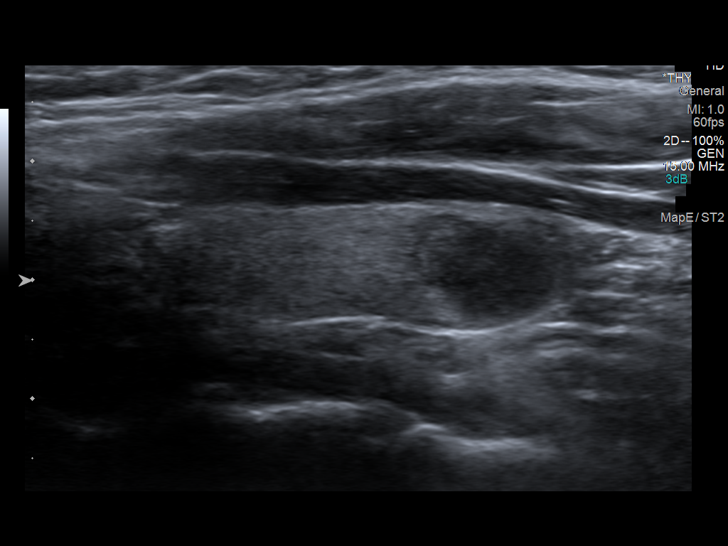
[im 18/29]
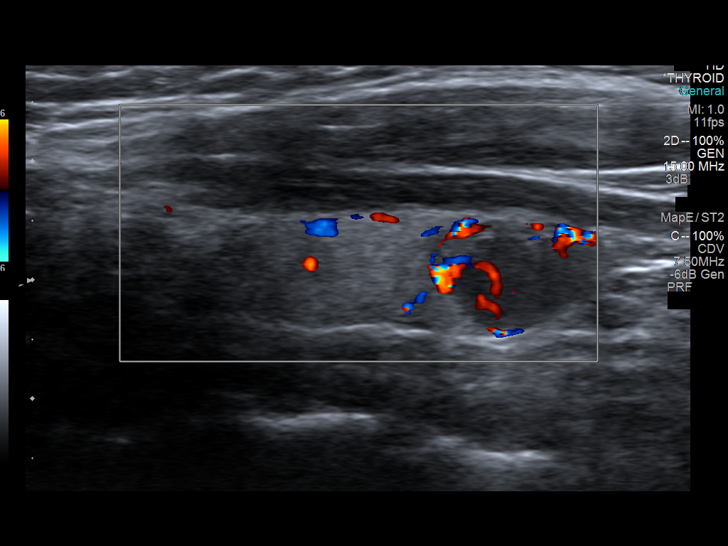
[im 19/29]
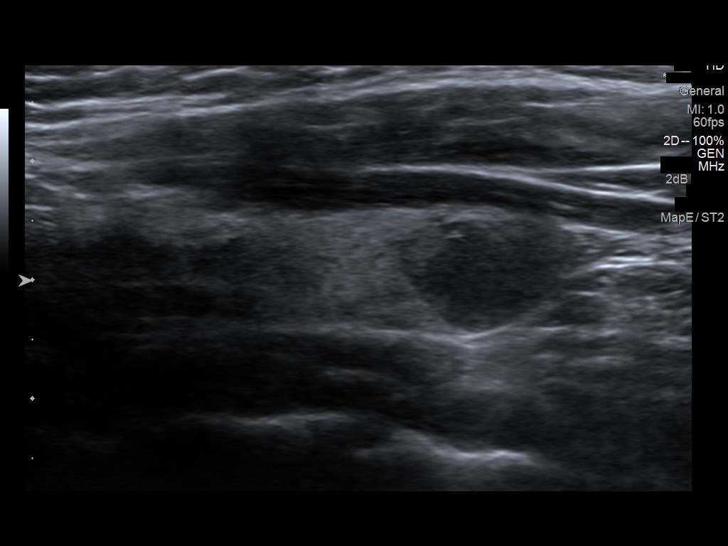
[im 22/29]
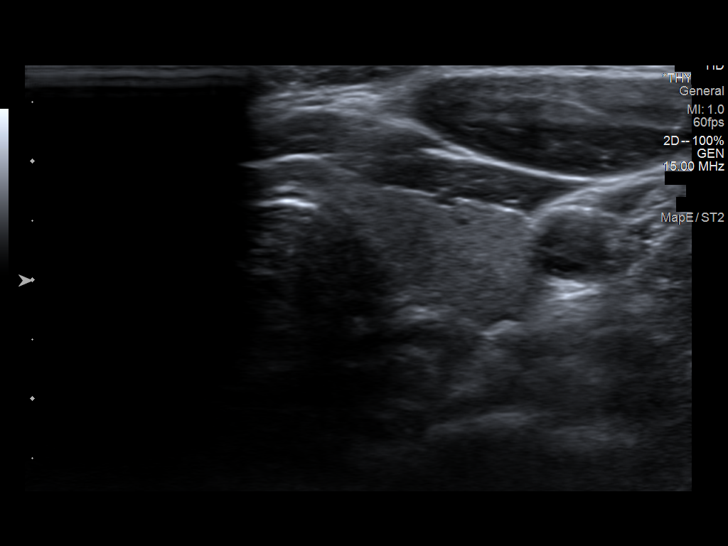
[im 24/29]
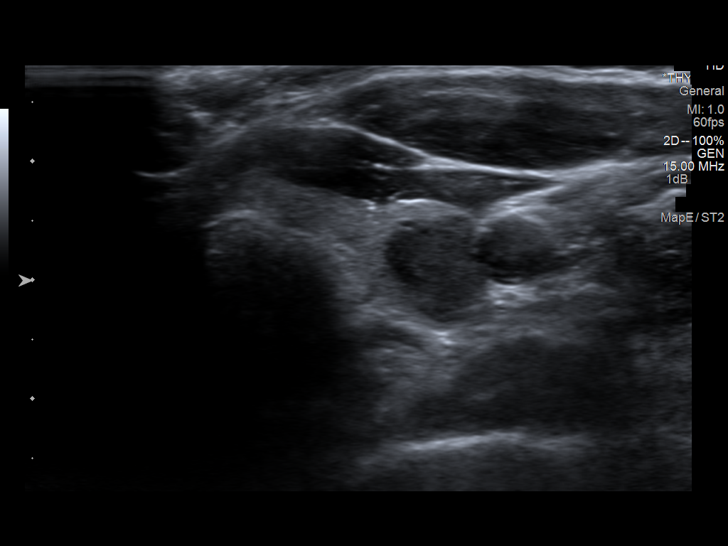
[im 26/29]
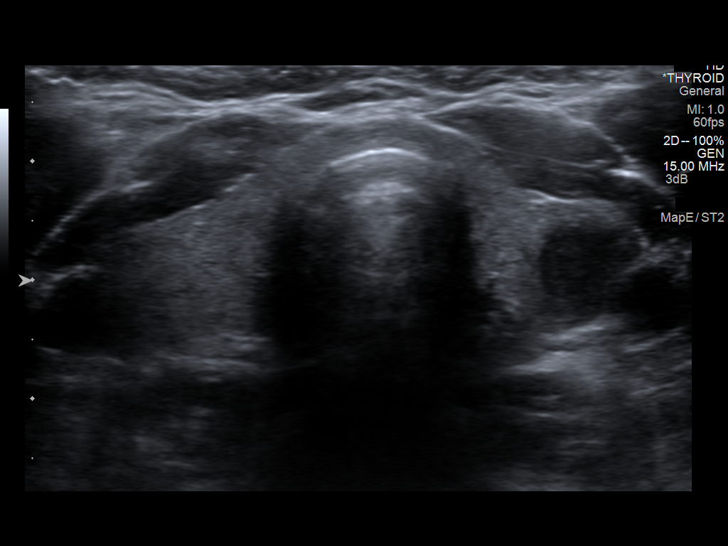
[im 29/29]
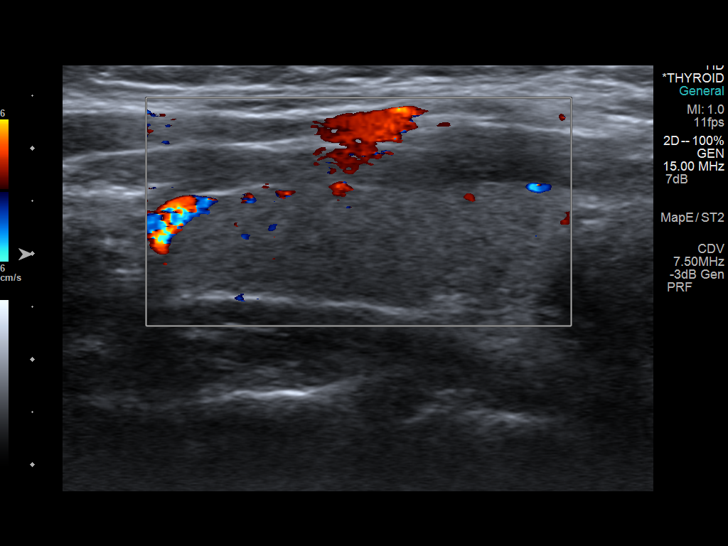

[14 of 25 positions shown; findings below may reference images not displayed]

FINDINGS: Right thyroid lobe

Measurements: 4.3 x 1.1 x 1.5 cm. There is a 4 x 2 x 3 mm hypoechoic
nodule within the upper pole. No microcalcifications or
hypervascularity.

Left thyroid lobe

Measurements: 3.7 x 1.0 x 1.2 cm.. Hypoechoic nodule within the
inferior pole measures 1.3 x 1.0 x 0.8 cm. Unchanged in size from
previous exam.

Isthmus

Thickness: 0.2 cm.  No nodules visualized.

Lymphadenopathy

None visualized.
IMPRESSION: Stable nodule stress set stable nodules. Findings do not meet
current SRU consensus criteria for biopsy. Follow-up by clinical
exam is recommended. If patient has known risk factors for thyroid
carcinoma, consider follow-up ultrasound in 12 months. If patient is
clinically hyperthyroid, consider nuclear medicine thyroid uptake
and scan.

Reference: Management of Thyroid Nodules Detected at US: Society of
Radiologists in Ultrasound Consensus Conference Statement. Radiology

## 2014-04-27 ENCOUNTER — Other Ambulatory Visit: Payer: Self-pay | Admitting: Gastroenterology

## 2014-05-04 ENCOUNTER — Telehealth: Payer: Self-pay | Admitting: Gastroenterology

## 2014-05-04 MED ORDER — OMEPRAZOLE-SODIUM BICARBONATE 40-1100 MG PO CAPS: 1.0000 | ORAL_CAPSULE | Freq: Every day | ORAL | Status: DC

## 2014-05-04 NOTE — Telephone Encounter (Signed)
Med sent.

## 2014-05-13 ENCOUNTER — Ambulatory Visit (INDEPENDENT_AMBULATORY_CARE_PROVIDER_SITE_OTHER): Payer: 59 | Admitting: Physician Assistant

## 2014-05-13 ENCOUNTER — Encounter: Payer: Self-pay | Admitting: Physician Assistant

## 2014-05-13 VITALS — BP 136/80 | HR 72 | Ht 63.0 in | Wt 198.0 lb

## 2014-05-13 DIAGNOSIS — K219 Gastro-esophageal reflux disease without esophagitis: Secondary | ICD-10-CM

## 2014-05-13 MED ORDER — OMEPRAZOLE-SODIUM BICARBONATE 40-1100 MG PO CAPS: 1.0000 | ORAL_CAPSULE | Freq: Every day | ORAL | Status: DC

## 2014-05-13 NOTE — Progress Notes (Signed)
   Subjective:    Patient ID: [name], female    DOB: 06/06/1966, 48 y.o.   MRN: [id number]  HPI [name] is a pleasant 48 year old white female known to Dr. Deatra Ina who has history of chronic GERD. She had undergone EGD in 2010 was noted to have a 3 cm hiatal hernia and a distal stricture which was dilated. She has been maintained on,Zegerid, and says as long as she takes her medication she does well. She has tried weaning herself off but generally within 48 hours her symptoms are back. 2 a very occasionally have a sporadic episode of dysphagia which she says sometimes feels like spasm. Family history is negative for colon cancer polyps Patient is status post cholecystectomy.    Review of Systems  Constitutional: Negative.   HENT: Negative.   Eyes: Negative.   Respiratory: Negative.   Cardiovascular: Negative.   Gastrointestinal: Negative.   Endocrine: Negative.   Genitourinary: Negative.   Musculoskeletal: Negative.   Skin: Negative.   Allergic/Immunologic: Negative.   Neurological: Negative.   Hematological: Negative.   Psychiatric/Behavioral: Negative.    Outpatient Prescriptions Prior to Visit  Medication Sig Dispense Refill  . albuterol (PROVENTIL HFA;VENTOLIN HFA) 108 (90 BASE) MCG/ACT inhaler Inhale 2 puffs into the lungs every 6 (six) hours as needed.        . fexofenadine (ALLEGRA) 180 MG tablet Take 180 mg by mouth daily.        . fluticasone-salmeterol (ADVAIR HFA) 115-21 MCG/ACT inhaler Inhale 2 puffs into the lungs 2 (two) times daily.        Marland Kitchen omeprazole-sodium bicarbonate (ZEGERID) 40-1100 MG per capsule Take 1 capsule by mouth daily before breakfast.  30 capsule  0   No facility-administered medications prior to visit.   Patient Active Problem List   Diagnosis Date Noted  . Abdominal pain, right lower quadrant 05/04/2012  . ESOPHAGEAL REFLUX 05/03/2009  . DYSPHAGIA UNSPECIFIED 05/03/2009   History  Substance Use Topics  . Smoking status: Never Smoker     . Smokeless tobacco: Never Used  . Alcohol Use: No   family history includes Diabetes in an other family member; Lung cancer in her mother. There is no history of Colon cancer.     Objective:   Physical Exam  well-developed white female in no acute distress, pleasant blood pressure 136/80 pulse 72 height 5 foot 3 weight 198 not further examined today        Assessment & Plan:  #60  48 year old female with chronic GERD-well controlled on daily Zegerid #2 history of esophageal stricture currently asymptomatic  Plan; refill Zegerid 40/1100 one by mouth every morning refills x1 year We discussed baseline bone density testing given long-term PPI therapy she will discuss this with her primary care physician at next visit,, also discussed potential magnesium deficiency. She takes a multivitamin which contains magnesium.. Patient will followup with Dr. Deatra Ina or myself on an as-needed basis

## 2014-05-13 NOTE — Patient Instructions (Signed)
We sent a prescription for Zegerid 40-1100 mg with  90 day supply multiple refills to [facility].   Follow up with Dr. Deatra Ina or Amy Esterwood PA-C in 1 year. Call us sooner if you need [place] for any problems.

## 2014-05-13 NOTE — Progress Notes (Signed)
Reviewed and agree with management. Simona Rocque D. Zilla Shartzer, M.D., FACG  

## 2014-09-09 ENCOUNTER — Telehealth: Payer: Self-pay | Admitting: *Deleted

## 2014-09-09 ENCOUNTER — Other Ambulatory Visit: Payer: Self-pay | Admitting: *Deleted

## 2014-09-09 MED ORDER — OMEPRAZOLE 40 MG PO CPDR: DELAYED_RELEASE_CAPSULE | ORAL | Status: DC

## 2014-09-09 MED ORDER — SODIUM BICARBONATE 650 MG PO TABS: ORAL_TABLET | ORAL | Status: DC

## 2014-09-09 NOTE — Telephone Encounter (Signed)
We received a fax from [facility] regarding this patient. Amy Esterwood PA-C had prescribed generic Zegerid  40 mg but it is no longer available.  The pharmacy asked if they could substitute with Omeprazole 40 mg, 1 tab daily and Sodium Bicarbonate 650 mg  2 tab daily,( Take with the Omeprazole).  I advised yes, # 30 on the Omeprazole 40 mg and # 60 on the sodium bicarbonate with 3 refills.  Amy had approved this for another pateint recently that we could not get Zegerid 40 mg for. I gave my name and Amy Esterwood PA-C as the prescriber.  I spoke to Butch Penny at [facility].

## 2014-09-29 ENCOUNTER — Other Ambulatory Visit (HOSPITAL_COMMUNITY): Payer: Self-pay | Admitting: Physician Assistant

## 2014-09-29 DIAGNOSIS — E041 Nontoxic single thyroid nodule: Secondary | ICD-10-CM

## 2014-10-04 ENCOUNTER — Ambulatory Visit (HOSPITAL_COMMUNITY): Payer: 59

## 2014-10-11 ENCOUNTER — Ambulatory Visit (HOSPITAL_COMMUNITY): Payer: 59

## 2014-10-14 ENCOUNTER — Ambulatory Visit (HOSPITAL_COMMUNITY): Payer: 59

## 2015-01-12 ENCOUNTER — Other Ambulatory Visit: Payer: Self-pay | Admitting: Physician Assistant

## 2015-01-12 ENCOUNTER — Telehealth: Payer: Self-pay | Admitting: *Deleted

## 2015-01-12 NOTE — Telephone Encounter (Signed)
Asked Amy Esterwood PA if she would refill the prescription for Omeprazole 40 mg and sodium Bicarbonate 650 mg 2 tab daily( this with the omeprazole).  I sent this refill request to [facility].

## 2015-08-15 DIAGNOSIS — D485 Neoplasm of uncertain behavior of skin: Secondary | ICD-10-CM | POA: Diagnosis not present

## 2015-08-15 DIAGNOSIS — D2261 Melanocytic nevi of right upper limb, including shoulder: Secondary | ICD-10-CM | POA: Diagnosis not present

## 2015-08-21 MED FILL — ESCITALOPRAM 20 MG TABLET: 20 | 30 days supply | Qty: 30 | Fill #8

## 2015-08-21 MED FILL — SODIUM BICARB 650 MG TABLET: 650 | 30 days supply | Qty: 60 | Fill #6

## 2015-08-21 MED FILL — OMEPRAZOLE DR 40 MG CAPSULE: 40 | 30 days supply | Qty: 30 | Fill #5

## 2015-09-27 DIAGNOSIS — F322 Major depressive disorder, single episode, severe without psychotic features: Secondary | ICD-10-CM | POA: Diagnosis not present

## 2015-09-27 DIAGNOSIS — Z8632 Personal history of gestational diabetes: Secondary | ICD-10-CM | POA: Diagnosis not present

## 2015-09-27 DIAGNOSIS — E7800 Pure hypercholesterolemia, unspecified: Secondary | ICD-10-CM | POA: Diagnosis not present

## 2015-09-27 MED FILL — SODIUM BICARB 650 MG TABLET: 650 | 30 days supply | Qty: 60 | Fill #7

## 2015-09-27 MED FILL — ESCITALOPRAM 20 MG TABLET: 20 | 90 days supply | Qty: 180 | Fill #0

## 2015-09-27 MED FILL — OMEPRAZOLE DR 40 MG CAPSULE: 40 | 30 days supply | Qty: 30 | Fill #6

## 2015-09-28 MED FILL — ALPRAZolam 0.25 MG TABS: 0.25 | 20 days supply | Qty: 20 | Fill #0

## 2015-09-29 DIAGNOSIS — H52222 Regular astigmatism, left eye: Secondary | ICD-10-CM | POA: Diagnosis not present

## 2015-09-29 DIAGNOSIS — E113293 Type 2 diabetes mellitus with mild nonproliferative diabetic retinopathy without macular edema, bilateral: Secondary | ICD-10-CM | POA: Diagnosis not present

## 2015-09-29 MED FILL — ATORVASTATIN 20 MG TABLET: 20 | 90 days supply | Qty: 90 | Fill #0

## 2015-10-05 ENCOUNTER — Encounter: Payer: 59 | Attending: Physician Assistant | Admitting: *Deleted

## 2015-10-05 ENCOUNTER — Encounter: Payer: Self-pay | Admitting: *Deleted

## 2015-10-05 VITALS — Ht 63.0 in | Wt 198.4 lb

## 2015-10-05 DIAGNOSIS — E118 Type 2 diabetes mellitus with unspecified complications: Secondary | ICD-10-CM | POA: Insufficient documentation

## 2015-10-05 DIAGNOSIS — E119 Type 2 diabetes mellitus without complications: Secondary | ICD-10-CM

## 2015-10-05 NOTE — Patient Instructions (Signed)
Plan:  Aim for 2-3 Carb Choices per meal (30-45 grams)   Aim for 0-1 Carbs per snack if hungry  Include protein in moderation with your meals and snacks Consider reading food labels for Total Carbohydrate of foods Consider the idea of getting a meter and checking BG at alternate times per day

## 2015-10-13 NOTE — Progress Notes (Signed)
Diabetes Self-Management Education  Visit Type: First/Initial  Appt. Start Time: 1400 Appt. End Time: [id number]  10/13/2015  [name], identified by name and date of birth, is a 50 y.o. female with a diagnosis of Diabetes: Type 2. Patient has 48 year old disabled daughter who needs constant supervision. She has husband and 65 year old son at home too. She works at [employer], so she is in a high stress job too. She has very limited time for herself including any time to be physically active without her daughter coming with her.   ASSESSMENT  Height 5\' 3"  (1.6 m), weight 198 lb 6.4 oz (89.994 kg). Body mass index is 35.15 kg/(m^2).      Diabetes Self-Management Education - 10/05/15 1406    Visit Information   Visit Type First/Initial   Initial Visit   Diabetes Type Type 2   Are you currently following a meal plan? No   Are you taking your medications as prescribed? Not on Medications   Date Diagnosed 09/28/2015   Health Coping   How would you rate your overall health? Fair   Psychosocial Assessment   Patient Belief/Attitude about Diabetes Motivated to manage diabetes   Self-care barriers None   Other persons present Patient   Patient Concerns Nutrition/Meal planning   Preferred Learning Style Visual;Hands on   Learning Readiness Ready   What is the last grade level you completed in school? 2 years college   Complications   Last HgB A1C per patient/outside source 6.6 %   How often do you check your blood sugar? Not recommended by provider   Have you had a dilated eye exam in the past 12 months? Yes   Have you had a dental exam in the past 12 months? No   Are you checking your feet? Yes   How many days per week are you checking your feet? 3   Dietary Intake   Breakfast skips often OR yogurt OR eggs, toast, bacon   Snack (morning) not unless [place] snackable with fresh fruit, nuts and cheese   Lunch sandwich with lean meat, sun chips on occasion   Snack  (afternoon) not usually   Dinner lean meat, occasionally a starch, vegetables usually OR salad night with lean meat   Snack (evening) used to have some ice cream or chocolate milk but not anymore   Beverage(s) water, unsweet tea, regular Dr. Malachi Bonds (20 oz / day)   Exercise   Exercise Type ADL's  limited due to caring for her daughter   How many days per week to you exercise? 0   How many minutes per day do you exercise? 0   Total minutes per week of exercise 0   Patient Education   Previous Diabetes Education Yes (please comment)  GDM 22 and 24 years ago   Disease state  Definition of diabetes, type 1 and 2, and the diagnosis of diabetes   Nutrition management  Role of diet in the treatment of diabetes and the relationship between the three main macronutrients and blood glucose level;Food label reading, portion sizes and measuring food.;Carbohydrate counting   Physical activity and exercise  Role of exercise on diabetes management, blood pressure control and cardiac health.   Monitoring Purpose and frequency of SMBG.   Chronic complications Relationship between chronic complications and blood glucose control   Psychosocial adjustment Helped patient identify a support system for diabetes management;Role of stress on diabetes   Individualized Goals (developed by patient)  Nutrition General guidelines for healthy choices and portions discussed   Physical Activity 15 minutes per day   Monitoring  Not Applicable   Health Coping ask for help with (comment)  look for back up support people to care for her daughter more often as able   Outcomes   Expected Outcomes Demonstrated interest in learning. Expect positive outcomes   Future DMSE PRN   Program Status Completed      Individualized Plan for Diabetes Self-Management Training:   Learning Objective:  Patient will have a greater understanding of diabetes self-management. Patient education plan is to attend individual and/or group  sessions per assessed needs and concerns.   Plan:   Patient Instructions  Plan:  Aim for 2-3 Carb Choices per meal (30-45 grams)   Aim for 0-1 Carbs per snack if hungry  Include protein in moderation with your meals and snacks Consider reading food labels for Total Carbohydrate of foods Consider the idea of getting a meter and checking BG at alternate times per day         Expected Outcomes:  Demonstrated interest in learning. Expect positive outcomes  Education material provided: Living Well with Diabetes, A1C conversion sheet, Meal plan card, Support group flyer and Carbohydrate counting sheet  If problems or questions, patient to contact team via:  Phone and Email  Future DSME appointment: PRN

## 2015-11-02 MED FILL — OMEPRAZOLE DR 40 MG CAPSULE: 40 | 30 days supply | Qty: 30 | Fill #7

## 2015-11-02 MED FILL — SODIUM BICARB 650 MG TABLET: 650 | 30 days supply | Qty: 60 | Fill #8

## 2015-11-10 DIAGNOSIS — Z1231 Encounter for screening mammogram for malignant neoplasm of breast: Secondary | ICD-10-CM | POA: Diagnosis not present

## 2015-11-10 DIAGNOSIS — N841 Polyp of cervix uteri: Secondary | ICD-10-CM | POA: Diagnosis not present

## 2015-11-10 DIAGNOSIS — Z6834 Body mass index (BMI) 34.0-34.9, adult: Secondary | ICD-10-CM | POA: Diagnosis not present

## 2015-11-10 DIAGNOSIS — Z01419 Encounter for gynecological examination (general) (routine) without abnormal findings: Secondary | ICD-10-CM | POA: Diagnosis not present

## 2015-11-22 ENCOUNTER — Encounter: Payer: Self-pay | Admitting: Physician Assistant

## 2015-11-29 DIAGNOSIS — R102 Pelvic and perineal pain: Secondary | ICD-10-CM | POA: Diagnosis not present

## 2015-11-29 DIAGNOSIS — N924 Excessive bleeding in the premenopausal period: Secondary | ICD-10-CM | POA: Diagnosis not present

## 2015-12-14 MED FILL — OMEPRAZOLE DR 40 MG CAPSULE: 40 | 30 days supply | Qty: 30 | Fill #8

## 2015-12-14 MED FILL — SODIUM BICARB 650 MG TABLET: 650 | 30 days supply | Qty: 60 | Fill #9

## 2016-01-11 MED FILL — OMEPRAZOLE DR 40 MG CAPSULE: 40 | 30 days supply | Qty: 30 | Fill #9

## 2016-01-11 MED FILL — SODIUM BICARB 650 MG TABLET: 650 | 30 days supply | Qty: 60 | Fill #10

## 2016-01-11 MED FILL — ESCITALOPRAM 20 MG TABLET: 20 | 90 days supply | Qty: 180 | Fill #1

## 2016-02-16 DIAGNOSIS — Z01818 Encounter for other preprocedural examination: Secondary | ICD-10-CM | POA: Diagnosis not present

## 2016-02-16 DIAGNOSIS — N920 Excessive and frequent menstruation with regular cycle: Secondary | ICD-10-CM | POA: Diagnosis not present

## 2016-02-16 DIAGNOSIS — N858 Other specified noninflammatory disorders of uterus: Secondary | ICD-10-CM | POA: Diagnosis not present

## 2016-02-20 ENCOUNTER — Telehealth: Payer: Self-pay | Admitting: Gastroenterology

## 2016-02-21 NOTE — Telephone Encounter (Signed)
Yes, it can be refilled for one month and one refill.  Must come to her August appointment if wants [place] to continue this med because has not been here since Oct 2015

## 2016-02-21 NOTE — Patient Instructions (Addendum)
Your procedure is scheduled on:  Tuesday, March 05, 2016  Enter through the Main Entrance of [facility] at:  6:00 AM  Pick up the phone at the desk and dial [phone].  Call this number if you have problems the morning of surgery: [phone].  Remember: Do NOT eat food or drink after:  Midnight Monday, March 04, 2016  Take these medicines the morning of surgery with a SIP OF WATER:  None  Bring Asthma Inhaler day of surgery  Do NOT wear jewelry (body piercing), metal hair clips/bobby pins, make-up, or nail polish. Do NOT wear lotions, powders, or perfumes.  You may wear deodorant. Do NOT shave for 48 hours prior to surgery. Do NOT bring valuables to the hospital. Contacts, dentures, or bridgework may not be worn into surgery.  Leave suitcase in car.  After surgery it may be brought to your room.  For patients admitted to the hospital, checkout time is 11:00 AM the day of discharge.

## 2016-02-21 NOTE — Telephone Encounter (Signed)
Sodium Bicarbonate 650mg  take 2 po qd, and Prilosec 40mg  qd  refill request. Pt has a follow to est care on 04-11-2016.

## 2016-02-22 ENCOUNTER — Encounter (HOSPITAL_COMMUNITY)
Admission: RE | Admit: 2016-02-22 | Discharge: 2016-02-22 | Disposition: A | Discharge status: Home / Self Care | Payer: 59 | Source: Ambulatory Visit | Attending: Obstetrics and Gynecology | Admitting: Obstetrics and Gynecology

## 2016-02-22 ENCOUNTER — Encounter (HOSPITAL_COMMUNITY): Payer: Self-pay

## 2016-02-22 DIAGNOSIS — Z01812 Encounter for preprocedural laboratory examination: Secondary | ICD-10-CM | POA: Diagnosis not present

## 2016-02-22 HISTORY — DX: Adverse effect of unspecified anesthetic, initial encounter: T41.45XA

## 2016-02-22 HISTORY — DX: Anemia, unspecified: D64.9

## 2016-02-22 HISTORY — DX: Other complications of anesthesia, initial encounter: T88.59XA

## 2016-02-22 LAB — CBC
HEMATOCRIT: 36.9 % (ref 36.0–46.0)
HEMOGLOBIN: 12.9 g/dL (ref 12.0–15.0)
MCH: 29.8 pg (ref 26.0–34.0)
MCHC: 35 g/dL (ref 30.0–36.0)
MCV: 85.2 fL (ref 78.0–100.0)
Platelets: 327 10*3/uL (ref 150–400)
RBC: 4.33 MIL/uL (ref 3.87–5.11)
RDW: 13.2 % (ref 11.5–15.5)
WBC: 10.6 10*3/uL — ABNORMAL HIGH (ref 4.0–10.5)

## 2016-02-22 LAB — COMPREHENSIVE METABOLIC PANEL
ALBUMIN: 4.4 g/dL (ref 3.5–5.0)
ALK PHOS: 52 U/L (ref 38–126)
ALT: 34 U/L (ref 14–54)
ANION GAP: 8 (ref 5–15)
AST: 24 U/L (ref 15–41)
BILIRUBIN TOTAL: 0.9 mg/dL (ref 0.3–1.2)
BUN: 21 mg/dL — ABNORMAL HIGH (ref 6–20)
CALCIUM: 9.4 mg/dL (ref 8.9–10.3)
CO2: 27 mmol/L (ref 22–32)
Chloride: 102 mmol/L (ref 101–111)
Creatinine, Ser: 0.63 mg/dL (ref 0.44–1.00)
GFR calc non Af Amer: >60 mL/min (ref >60)
GLUCOSE: 149 mg/dL — AB (ref 65–99)
Potassium: 3.8 mmol/L (ref 3.5–5.1)
Sodium: 137 mmol/L (ref 135–145)
TOTAL PROTEIN: 8.1 g/dL (ref 6.5–8.1)

## 2016-02-22 MED ORDER — OMEPRAZOLE 40 MG PO CPDR: 40.0000 mg | DELAYED_RELEASE_CAPSULE | Freq: Every day | ORAL | Status: DC

## 2016-02-22 MED ORDER — OMEPRAZOLE-SODIUM BICARBONATE 40-1100 MG PO CAPS: 1.0000 | ORAL_CAPSULE | Freq: Every day | ORAL | Status: DC

## 2016-02-22 MED FILL — OMEPRAZOLE DR 40 MG CAPSULE: 40 | 30 days supply | Qty: 30 | Fill #0

## 2016-02-22 NOTE — Telephone Encounter (Signed)
Refills given as directed.

## 2016-02-26 ENCOUNTER — Telehealth: Payer: Self-pay | Admitting: Gastroenterology

## 2016-02-26 MED ORDER — OMEPRAZOLE-SODIUM BICARBONATE 40-1100 MG PO CAPS: 1.0000 | ORAL_CAPSULE | Freq: Every day | ORAL | Status: DC

## 2016-02-26 NOTE — Telephone Encounter (Signed)
Rx was already approved by Dr Loletha Carrow on 02-22-16 see phone note. RX resent.

## 2016-02-27 ENCOUNTER — Other Ambulatory Visit: Payer: Self-pay

## 2016-02-27 MED ORDER — SODIUM BICARBONATE 650 MG PO TABS: 1300.0000 mg | ORAL_TABLET | Freq: Every day | ORAL | Status: DC

## 2016-02-27 MED FILL — SODIUM BICARB 650 MG TABLET: 650 | 30 days supply | Qty: 60 | Fill #0

## 2016-03-04 MED ORDER — DEXTROSE 5 % IV SOLN
INTRAVENOUS | Status: AC
  Administered 2016-03-05: 100 mL via INTRAVENOUS
  Filled 2016-03-04: qty 8.25

## 2016-03-04 NOTE — H&P (Signed)
[name] is an 50 y.o. female G2P2 husband S/P vasectomy with heavy menses. U/S in office showed 52mm intamural fibroid. EMB is benign.  Pertinent Gynecological History: Menses: flow is excessive with use of many pads or tampons on heaviest days Bleeding: heavy Contraception: vasectomy DES exposure: denies Blood transfusions: none Sexually transmitted diseases: no past history Previous GYN Procedures: none  Last mammogram: normal Date: 2017 Last pap: normal Date: 2017 OB History: G2, P2   Menstrual History: Menarche age: unknown Patient's last menstrual period was 01/25/2016 (exact date).    Past Medical History:  Diagnosis Date  . Anemia   . Asthma   . Cancer ([place])    basal cell  . Complication of anesthesia    hard time waking up afterwards  . Diabetes mellitus without complication (HCC)    diet controlled  . GERD (gastroesophageal reflux disease)   . Hyperlipidemia     Past Surgical History:  Procedure Laterality Date  . CHOLECYSTECTOMY    . ROTATOR CUFF REPAIR     right    Family History  Problem Relation Age of Onset  . Colon cancer Neg Hx   . Diabetes      mat. side, diet controlled  . Lung cancer Mother     non-smoker, ? original site    Social History:  reports that she has never smoked. She has never used smokeless tobacco. She reports that she drinks alcohol. She reports that she does not use drugs.  Allergies:  Allergies  Allergen Reactions  . Codeine Other (See Comments)    Patient feels "wacked up"  . Penicillins Hives and Swelling    Massive swelling    No prescriptions prior to admission.    Review of Systems  Constitutional: Negative for fever.    Last menstrual period 01/25/2016. Physical Exam  Cardiovascular: Normal rate and regular rhythm.   Respiratory: Effort normal and breath sounds normal.  GI: Soft. There is no tenderness.  Genitourinary:  Genitourinary Comments: Uterus 8 weeks size, mobile Adnexa without  masses    No results found for this or any previous visit (from the past 24 hour(s)).  No results found.  Assessment/Plan: 50 yo G2P2 with menorrhagia D/W patient LAVH/BSO. She favors removal of normal appearing ovaries. Risks reviewed including infection, organ damage, bleeding/transfusion-HIV/Hep, DVT/PE, pneumonia, fistula, laparotomy, return to OR, pelvic/abdominal pain, painful intercourse. Issues of menopause also discussed. Patient states she understands and agrees.  [name],[name] E 03/04/2016, 5:26 PM

## 2016-03-04 NOTE — Anesthesia Preprocedure Evaluation (Addendum)
Anesthesia Evaluation  Patient identified by MRN, date of birth, ID band Patient awake    Reviewed: Allergy & Precautions, NPO status , Patient's Chart, lab work & pertinent test results  History of Anesthesia Complications (+) PROLONGED EMERGENCE and history of anesthetic complications  Airway Mallampati: III  TM Distance: >3 FB Neck ROM: Full    Dental  (+) Teeth Intact, Dental Advisory Given   Pulmonary asthma ,    Pulmonary exam normal breath sounds clear to auscultation       Cardiovascular Exercise Tolerance: Good negative cardio ROS Normal cardiovascular exam Rhythm:Regular Rate:Normal     Neuro/Psych negative neurological ROS  negative psych ROS   GI/Hepatic Neg liver ROS, GERD  Medicated and Controlled,  Endo/Other  diabetes (diet controlled), Well Controlled, Type 2Obesity   Renal/GU negative Renal ROS     Musculoskeletal negative musculoskeletal ROS (+)   Abdominal   Peds  Hematology negative hematology ROS (+)   Anesthesia Other Findings Day of surgery medications reviewed with the patient.  Reproductive/Obstetrics negative OB ROS                            Anesthesia Physical Anesthesia Plan  ASA: II  Anesthesia Plan: General   Post-op Pain Management:    Induction: Intravenous  Airway Management Planned: Oral ETT  Additional Equipment:   Intra-op Plan:   Post-operative Plan: Extubation in OR  Informed Consent: I have reviewed the patients History and Physical, chart, labs and discussed the procedure including the risks, benefits and alternatives for the proposed anesthesia with the patient or authorized representative who has indicated his/her understanding and acceptance.   Dental advisory given  Plan Discussed with: CRNA  Anesthesia Plan Comments: (Risks/benefits of general anesthesia discussed with patient including risk of damage to teeth, lips, gum,  and tongue, nausea/vomiting, allergic reactions to medications, and the possibility of heart attack, stroke and death.  All patient questions answered.  Patient wishes to proceed.)        Anesthesia Quick Evaluation

## 2016-03-05 ENCOUNTER — Encounter (HOSPITAL_COMMUNITY)
Admission: RE | Disposition: A | Discharge status: Home / Self Care | Payer: Self-pay | Source: Ambulatory Visit | Attending: Obstetrics and Gynecology

## 2016-03-05 ENCOUNTER — Observation Stay (HOSPITAL_COMMUNITY)
Admission: RE | Admit: 2016-03-05 | Discharge: 2016-03-06 | Disposition: A | Discharge status: Home / Self Care | Payer: 59 | Source: Ambulatory Visit | Attending: Obstetrics and Gynecology | Admitting: Obstetrics and Gynecology

## 2016-03-05 ENCOUNTER — Ambulatory Visit (HOSPITAL_COMMUNITY): Payer: 59 | Admitting: Anesthesiology

## 2016-03-05 ENCOUNTER — Encounter (HOSPITAL_COMMUNITY): Payer: Self-pay

## 2016-03-05 DIAGNOSIS — K219 Gastro-esophageal reflux disease without esophagitis: Secondary | ICD-10-CM | POA: Insufficient documentation

## 2016-03-05 DIAGNOSIS — D259 Leiomyoma of uterus, unspecified: Secondary | ICD-10-CM | POA: Diagnosis not present

## 2016-03-05 DIAGNOSIS — D251 Intramural leiomyoma of uterus: Secondary | ICD-10-CM | POA: Insufficient documentation

## 2016-03-05 DIAGNOSIS — E119 Type 2 diabetes mellitus without complications: Secondary | ICD-10-CM | POA: Diagnosis not present

## 2016-03-05 DIAGNOSIS — E669 Obesity, unspecified: Secondary | ICD-10-CM | POA: Diagnosis not present

## 2016-03-05 DIAGNOSIS — N920 Excessive and frequent menstruation with regular cycle: Principal | ICD-10-CM | POA: Insufficient documentation

## 2016-03-05 DIAGNOSIS — Z880 Allergy status to penicillin: Secondary | ICD-10-CM | POA: Diagnosis not present

## 2016-03-05 DIAGNOSIS — N83202 Unspecified ovarian cyst, left side: Secondary | ICD-10-CM | POA: Insufficient documentation

## 2016-03-05 DIAGNOSIS — N83201 Unspecified ovarian cyst, right side: Secondary | ICD-10-CM | POA: Insufficient documentation

## 2016-03-05 DIAGNOSIS — Z6834 Body mass index (BMI) 34.0-34.9, adult: Secondary | ICD-10-CM | POA: Insufficient documentation

## 2016-03-05 HISTORY — PX: LAPAROSCOPIC VAGINAL HYSTERECTOMY WITH SALPINGO OOPHORECTOMY: SHX6681

## 2016-03-05 LAB — GLUCOSE, CAPILLARY: Glucose-Capillary: 148 mg/dL — ABNORMAL HIGH (ref 65–99)

## 2016-03-05 SURGERY — HYSTERECTOMY, VAGINAL, LAPAROSCOPY-ASSISTED, WITH SALPINGO-OOPHORECTOMY
Anesthesia: General | Site: Abdomen | Laterality: Bilateral

## 2016-03-05 MED ORDER — NEOSTIGMINE METHYLSULFATE 10 MG/10ML IV SOLN
INTRAVENOUS | Status: DC | PRN
  Administered 2016-03-05: 5 mg via INTRAVENOUS

## 2016-03-05 MED ORDER — SCOPOLAMINE 1 MG/3DAYS TD PT72
1.0000 | MEDICATED_PATCH | Freq: Once | TRANSDERMAL | Status: DC
  Administered 2016-03-05: 1.5 mg via TRANSDERMAL

## 2016-03-05 MED ORDER — ALBUTEROL SULFATE HFA 108 (90 BASE) MCG/ACT IN AERS
INHALATION_SPRAY | RESPIRATORY_TRACT | Status: DC | PRN
  Administered 2016-03-05: 2 via RESPIRATORY_TRACT

## 2016-03-05 MED ORDER — NEOSTIGMINE METHYLSULFATE 10 MG/10ML IV SOLN
INTRAVENOUS | Status: AC
  Filled 2016-03-05: qty 1

## 2016-03-05 MED ORDER — PROPOFOL 10 MG/ML IV BOLUS
INTRAVENOUS | Status: DC | PRN
  Administered 2016-03-05: 150 mg via INTRAVENOUS

## 2016-03-05 MED ORDER — GLYCOPYRROLATE 0.2 MG/ML IJ SOLN
INTRAMUSCULAR | Status: AC
  Filled 2016-03-05: qty 4

## 2016-03-05 MED ORDER — ACETAMINOPHEN 325 MG PO TABS: 650.0000 mg | ORAL_TABLET | Freq: Four times a day (QID) | ORAL | Status: DC | PRN

## 2016-03-05 MED ORDER — MAGNESIUM HYDROXIDE 400 MG/5ML PO SUSP: 30.0000 mL | Freq: Every day | ORAL | Status: DC | PRN

## 2016-03-05 MED ORDER — EPHEDRINE 5 MG/ML INJ
INTRAVENOUS | Status: AC
  Filled 2016-03-05: qty 10

## 2016-03-05 MED ORDER — DIPHENHYDRAMINE HCL 12.5 MG/5ML PO ELIX
12.5000 mg | ORAL_SOLUTION | Freq: Four times a day (QID) | ORAL | Status: DC | PRN
Start: 2016-03-05 — End: 2016-03-05
  Filled 2016-03-05: qty 5

## 2016-03-05 MED ORDER — BUPIVACAINE HCL (PF) 0.5 % IJ SOLN
INTRAMUSCULAR | Status: DC | PRN
  Administered 2016-03-05: 7 mL
  Administered 2016-03-05: 23 mL

## 2016-03-05 MED ORDER — FENTANYL CITRATE (PF) 100 MCG/2ML IJ SOLN
INTRAMUSCULAR | Status: DC | PRN
  Administered 2016-03-05: 50 ug via INTRAVENOUS
  Administered 2016-03-05: 100 ug via INTRAVENOUS

## 2016-03-05 MED ORDER — BUPIVACAINE HCL (PF) 0.5 % IJ SOLN
INTRAMUSCULAR | Status: AC
  Filled 2016-03-05: qty 30

## 2016-03-05 MED ORDER — SODIUM CHLORIDE 0.9% FLUSH: 9.0000 mL | INTRAVENOUS | Status: DC | PRN

## 2016-03-05 MED ORDER — IBUPROFEN 600 MG PO TABS
600.0000 mg | ORAL_TABLET | Freq: Four times a day (QID) | ORAL | Status: DC | PRN
  Administered 2016-03-06: 600 mg via ORAL
  Filled 2016-03-05: qty 1

## 2016-03-05 MED ORDER — SODIUM CHLORIDE 0.9 % IJ SOLN
INTRAMUSCULAR | Status: AC
  Filled 2016-03-05: qty 10

## 2016-03-05 MED ORDER — GLYCOPYRROLATE 0.2 MG/ML IJ SOLN
INTRAMUSCULAR | Status: DC | PRN
  Administered 2016-03-05: 0.2 mg via INTRAVENOUS
  Administered 2016-03-05: .8 mg via INTRAVENOUS

## 2016-03-05 MED ORDER — ROCURONIUM BROMIDE 100 MG/10ML IV SOLN
INTRAVENOUS | Status: AC
  Filled 2016-03-05: qty 1

## 2016-03-05 MED ORDER — ESCITALOPRAM OXALATE 20 MG PO TABS
40.0000 mg | ORAL_TABLET | Freq: Every day | ORAL | Status: DC
  Filled 2016-03-05 (×2): qty 2

## 2016-03-05 MED ORDER — PROPOFOL 10 MG/ML IV BOLUS
INTRAVENOUS | Status: AC
  Filled 2016-03-05: qty 20

## 2016-03-05 MED ORDER — MIDAZOLAM HCL 2 MG/2ML IJ SOLN
INTRAMUSCULAR | Status: AC
  Filled 2016-03-05: qty 2

## 2016-03-05 MED ORDER — DIPHENHYDRAMINE HCL 50 MG/ML IJ SOLN: 12.5000 mg | Freq: Four times a day (QID) | INTRAMUSCULAR | Status: DC | PRN

## 2016-03-05 MED ORDER — PROMETHAZINE HCL 25 MG/ML IJ SOLN: 6.2500 mg | INTRAMUSCULAR | Status: DC | PRN

## 2016-03-05 MED ORDER — LIDOCAINE HCL (CARDIAC) 20 MG/ML IV SOLN
INTRAVENOUS | Status: AC
  Filled 2016-03-05: qty 5

## 2016-03-05 MED ORDER — FENTANYL 40 MCG/ML IV SOLN
INTRAVENOUS | Status: DC
  Administered 2016-03-05: 60 ug via INTRAVENOUS
  Administered 2016-03-05: 11:00:00 via INTRAVENOUS
  Administered 2016-03-05: 2 ug via INTRAVENOUS
  Filled 2016-03-05: qty 25

## 2016-03-05 MED ORDER — FENTANYL CITRATE (PF) 100 MCG/2ML IJ SOLN
25.0000 ug | INTRAMUSCULAR | Status: DC | PRN
Start: 2016-03-05 — End: 2016-03-05

## 2016-03-05 MED ORDER — ONDANSETRON HCL 4 MG/2ML IJ SOLN
INTRAMUSCULAR | Status: DC | PRN
  Administered 2016-03-05: 4 mg via INTRAVENOUS

## 2016-03-05 MED ORDER — MENTHOL 3 MG MT LOZG: 1.0000 | LOZENGE | OROMUCOSAL | Status: DC | PRN

## 2016-03-05 MED ORDER — SENNA 8.6 MG PO TABS
1.0000 | ORAL_TABLET | Freq: Two times a day (BID) | ORAL | Status: DC
  Administered 2016-03-05: 8.6 mg via ORAL
  Filled 2016-03-05 (×4): qty 1

## 2016-03-05 MED ORDER — TRAMADOL HCL 50 MG PO TABS
50.0000 mg | ORAL_TABLET | Freq: Four times a day (QID) | ORAL | Status: DC | PRN
  Administered 2016-03-05: 50 mg via ORAL
  Filled 2016-03-05: qty 1

## 2016-03-05 MED ORDER — EPHEDRINE SULFATE 50 MG/ML IJ SOLN
INTRAMUSCULAR | Status: DC | PRN
  Administered 2016-03-05: 10 mg via INTRAVENOUS
  Administered 2016-03-05: 5 mg via INTRAVENOUS

## 2016-03-05 MED ORDER — DEXAMETHASONE SODIUM PHOSPHATE 10 MG/ML IJ SOLN
INTRAMUSCULAR | Status: DC | PRN
Start: 2016-03-05 — End: 2016-03-05
  Administered 2016-03-05: 4 mg via INTRAVENOUS

## 2016-03-05 MED ORDER — LACTATED RINGERS IV SOLN
INTRAVENOUS | Status: DC
  Administered 2016-03-05 (×3): via INTRAVENOUS

## 2016-03-05 MED ORDER — ALUM & MAG HYDROXIDE-SIMETH 200-200-20 MG/5ML PO SUSP: 30.0000 mL | ORAL | Status: DC | PRN

## 2016-03-05 MED ORDER — ALBUTEROL SULFATE (2.5 MG/3ML) 0.083% IN NEBU: 3.0000 mL | INHALATION_SOLUTION | Freq: Four times a day (QID) | RESPIRATORY_TRACT | Status: DC | PRN

## 2016-03-05 MED ORDER — MOMETASONE FURO-FORMOTEROL FUM 200-5 MCG/ACT IN AERO
2.0000 | INHALATION_SPRAY | Freq: Two times a day (BID) | RESPIRATORY_TRACT | Status: DC
  Administered 2016-03-05: 2 via RESPIRATORY_TRACT
  Filled 2016-03-05: qty 8.8

## 2016-03-05 MED ORDER — ONDANSETRON HCL 4 MG/2ML IJ SOLN
INTRAMUSCULAR | Status: AC
  Filled 2016-03-05: qty 2

## 2016-03-05 MED ORDER — MIDAZOLAM HCL 2 MG/2ML IJ SOLN
INTRAMUSCULAR | Status: DC | PRN
  Administered 2016-03-05: 2 mg via INTRAVENOUS

## 2016-03-05 MED ORDER — DEXAMETHASONE SODIUM PHOSPHATE 4 MG/ML IJ SOLN
INTRAMUSCULAR | Status: AC
  Filled 2016-03-05: qty 1

## 2016-03-05 MED ORDER — ONDANSETRON HCL 4 MG/2ML IJ SOLN: 4.0000 mg | Freq: Four times a day (QID) | INTRAMUSCULAR | Status: DC | PRN

## 2016-03-05 MED ORDER — ZOLPIDEM TARTRATE 5 MG PO TABS
5.0000 mg | ORAL_TABLET | Freq: Every evening | ORAL | Status: DC | PRN
  Administered 2016-03-05: 5 mg via ORAL
  Filled 2016-03-05: qty 1

## 2016-03-05 MED ORDER — LIDOCAINE HCL (CARDIAC) 20 MG/ML IV SOLN
INTRAVENOUS | Status: DC | PRN
  Administered 2016-03-05: 100 mg via INTRAVENOUS

## 2016-03-05 MED ORDER — ROCURONIUM BROMIDE 100 MG/10ML IV SOLN
INTRAVENOUS | Status: DC | PRN
  Administered 2016-03-05: 50 mg via INTRAVENOUS

## 2016-03-05 MED ORDER — DEXTROSE IN LACTATED RINGERS 5 % IV SOLN
INTRAVENOUS | Status: DC
  Administered 2016-03-05 – 2016-03-06 (×2): via INTRAVENOUS

## 2016-03-05 MED ORDER — PHENYLEPHRINE 40 MCG/ML (10ML) SYRINGE FOR IV PUSH (FOR BLOOD PRESSURE SUPPORT)
PREFILLED_SYRINGE | INTRAVENOUS | Status: AC
  Filled 2016-03-05: qty 10

## 2016-03-05 MED ORDER — NALOXONE HCL 0.4 MG/ML IJ SOLN: 0.4000 mg | INTRAMUSCULAR | Status: DC | PRN

## 2016-03-05 MED ORDER — ONDANSETRON HCL 4 MG PO TABS: 4.0000 mg | ORAL_TABLET | Freq: Four times a day (QID) | ORAL | Status: DC | PRN

## 2016-03-05 MED ORDER — PANTOPRAZOLE SODIUM 40 MG PO TBEC: 40.0000 mg | DELAYED_RELEASE_TABLET | Freq: Every day | ORAL | Status: DC

## 2016-03-05 MED ORDER — PHENYLEPHRINE HCL 10 MG/ML IJ SOLN
INTRAMUSCULAR | Status: DC | PRN
  Administered 2016-03-05: 40 ug via INTRAVENOUS

## 2016-03-05 MED ORDER — FENTANYL CITRATE (PF) 250 MCG/5ML IJ SOLN
INTRAMUSCULAR | Status: AC
  Filled 2016-03-05: qty 5

## 2016-03-05 MED ORDER — SCOPOLAMINE 1 MG/3DAYS TD PT72
MEDICATED_PATCH | TRANSDERMAL | Status: DC
Start: 2016-03-05 — End: 2016-03-06
  Administered 2016-03-05: 1.5 mg via TRANSDERMAL
  Filled 2016-03-05: qty 1

## 2016-03-05 SURGICAL SUPPLY — 47 items
CABLE HIGH FREQUENCY MONO STRZ (ELECTRODE) ×2 IMPLANT
CATH ROBINSON RED A/P 16FR (CATHETERS) ×3 IMPLANT
CLOTH BEACON ORANGE TIMEOUT ST (SAFETY) ×3 IMPLANT
CONT PATH 16OZ SNAP LID 3702 (MISCELLANEOUS) ×3 IMPLANT
COVER BACK TABLE 60X90IN (DRAPES) ×3 IMPLANT
DECANTER SPIKE VIAL GLASS SM (MISCELLANEOUS) ×3 IMPLANT
DRSG COVADERM PLUS 2X2 (GAUZE/BANDAGES/DRESSINGS) ×6 IMPLANT
DRSG OPSITE POSTOP 3X4 (GAUZE/BANDAGES/DRESSINGS) IMPLANT
DURAPREP 26ML APPLICATOR (WOUND CARE) ×3 IMPLANT
ELECT LIGASURE LONG (ELECTRODE) ×2 IMPLANT
ELECT REM PT RETURN 9FT ADLT (ELECTROSURGICAL) ×3
ELECTRODE REM PT RTRN 9FT ADLT (ELECTROSURGICAL) IMPLANT
FILTER SMOKE EVAC LAPAROSHD (FILTER) ×3 IMPLANT
GLOVE BIO SURGEON STRL SZ8 (GLOVE) ×6 IMPLANT
GLOVE BIOGEL PI IND STRL 6.5 (GLOVE) ×1 IMPLANT
GLOVE BIOGEL PI IND STRL 7.0 (GLOVE) ×1 IMPLANT
GLOVE BIOGEL PI IND STRL 8 (GLOVE) ×1 IMPLANT
GLOVE BIOGEL PI INDICATOR 6.5 (GLOVE) ×2
GLOVE BIOGEL PI INDICATOR 7.0 (GLOVE) ×2
GLOVE BIOGEL PI INDICATOR 8 (GLOVE) ×2
GOWN STRL REUS W/ TWL XL LVL3 (GOWN DISPOSABLE) ×2 IMPLANT
GOWN STRL REUS W/TWL XL LVL3 (GOWN DISPOSABLE) ×6
LEGGING LITHOTOMY PAIR STRL (DRAPES) ×3 IMPLANT
LIQUID BAND (GAUZE/BANDAGES/DRESSINGS) ×3 IMPLANT
NEEDLE INSUFFLATION 120MM (ENDOMECHANICALS) ×3 IMPLANT
NS IRRIG 1000ML POUR BTL (IV SOLUTION) ×3 IMPLANT
PACK LAVH (CUSTOM PROCEDURE TRAY) ×3 IMPLANT
PACK ROBOTIC GOWN (GOWN DISPOSABLE) ×3 IMPLANT
PAD TRENDELENBURG POSITION (MISCELLANEOUS) ×3 IMPLANT
SCISSORS LAP 5X45 EPIX DISP (ENDOMECHANICALS) IMPLANT
SEALER TISSUE G2 CVD JAW 45CM (ENDOMECHANICALS) ×3 IMPLANT
SET CYSTO W/LG BORE CLAMP LF (SET/KITS/TRAYS/PACK) IMPLANT
SET IRRIG TUBING LAPAROSCOPIC (IRRIGATION / IRRIGATOR) IMPLANT
SLEEVE XCEL OPT CAN 5 100 (ENDOMECHANICALS) ×3 IMPLANT
SUT MNCRL 0 MO-4 VIOLET 18 CR (SUTURE) ×1 IMPLANT
SUT MNCRL 0 VIOLET 6X18 (SUTURE) ×1 IMPLANT
SUT MONOCRYL 0 6X18 (SUTURE) ×2
SUT MONOCRYL 0 MO 4 18  CR/8 (SUTURE) ×4
SUT VIC AB 4-0 PS2 27 (SUTURE) ×3 IMPLANT
SUT VICRYL 0 UR6 27IN ABS (SUTURE) ×2 IMPLANT
SYR 30ML LL (SYRINGE) ×3 IMPLANT
TOWEL OR 17X24 6PK STRL BLUE (TOWEL DISPOSABLE) ×6 IMPLANT
TRAY FOLEY CATH SILVER 14FR (SET/KITS/TRAYS/PACK) ×3 IMPLANT
TROCAR XCEL NON-BLD 11X100MML (ENDOMECHANICALS) ×3 IMPLANT
TROCAR XCEL NON-BLD 5MMX100MML (ENDOMECHANICALS) ×3 IMPLANT
WARMER LAPAROSCOPE (MISCELLANEOUS) ×3 IMPLANT
WATER STERILE IRR 1000ML POUR (IV SOLUTION) ×3 IMPLANT

## 2016-03-05 NOTE — Anesthesia Postprocedure Evaluation (Signed)
Anesthesia Post Note  Patient: [name]  Procedure(s) Performed: Procedure(s) (LRB): LAPAROSCOPIC ASSISTED VAGINAL HYSTERECTOMY WITH BILATERAL SALPINGO OOPHORECTOMY (Bilateral)  Patient location during evaluation: [facility] Anesthesia Type: General Level of consciousness: awake, awake and alert, oriented and patient cooperative Pain management: pain level controlled Vital Signs Assessment: post-procedure vital signs reviewed and stable Respiratory status: spontaneous breathing, nonlabored ventilation, respiratory function stable and patient connected to nasal cannula oxygen Cardiovascular status: stable Postop Assessment: patient able to bend at knees, no headache, no signs of nausea or vomiting and no backache Anesthetic complications: no     Last Vitals:  Vitals:   03/05/16 1045 03/05/16 1145  BP: 140/81 132/77  Pulse: 94 92  Resp: 20 17  Temp: 36.9 C 36.5 C    Last Pain:  Vitals:   03/05/16 1145  TempSrc: Oral  PainSc:    Pain Goal: Patients Stated Pain Goal: 5 (03/05/16 1045)               Soren Lazarz L

## 2016-03-05 NOTE — Brief Op Note (Signed)
03/05/2016  8:36 AM  PATIENT:  [name]  50 y.o. female  PRE-OPERATIVE DIAGNOSIS:  menorrhagia  POST-OPERATIVE DIAGNOSIS:  MENORRHAGIA  PROCEDURE:  Procedure(s): LAPAROSCOPIC ASSISTED VAGINAL HYSTERECTOMY WITH BILATERAL SALPINGO OOPHORECTOMY (Bilateral)  SURGEON:  Surgeon(s) and Role:    * Everlene Farrier, MD - Primary    * Marylynn Pearson, MD - Assisting  PHYSICIAN ASSISTANT:   ASSISTANTS: none   ANESTHESIA:   general  EBL:  Total I/O In: 1000 [I.V.:1000] Out: 30 [Urine:30]  BLOOD ADMINISTERED:none  DRAINS: Urinary Catheter (Foley)   LOCAL MEDICATIONS USED:  MARCAINE    and Amount: 30 ml  SPECIMEN:  Source of Specimen:  uterus, bilateral tubes and ovaries  DISPOSITION OF SPECIMEN:  PATHOLOGY  COUNTS:  YES  TOURNIQUET:  * No tourniquets in log *  DICTATION: .Other Dictation: Dictation Number [id number]  PLAN OF CARE: Admit for overnight observation  PATIENT DISPOSITION:  PACU - hemodynamically stable.   Delay start of Pharmacological VTE agent (>24hrs) due to surgical blood loss or risk of bleeding: not applicable

## 2016-03-05 NOTE — Anesthesia Procedure Notes (Signed)
Procedure Name: Intubation Date/Time: 03/05/2016 7:27 AM Performed by: Leza Apsey G Pre-anesthesia Checklist: Patient identified, Timeout performed, Emergency Drugs available, Suction available and Patient being monitored Patient Re-evaluated:Patient Re-evaluated prior to inductionOxygen Delivery Method: Circle system utilized Preoxygenation: Pre-oxygenation with 100% oxygen Intubation Type: IV induction Ventilation: Mask ventilation without difficulty Laryngoscope Size: Mac and 3 Grade View: Grade I Tube type: Oral Number of attempts: 1 Airway Equipment and Method: Stylet Placement Confirmation: ETT inserted through vocal cords under direct vision,  positive ETCO2,  CO2 detector and breath sounds checked- equal and bilateral Secured at: 20 cm Tube secured with: Tape Dental Injury: Teeth and Oropharynx as per pre-operative assessment

## 2016-03-05 NOTE — Progress Notes (Signed)
No changes to H&P per patient history Reviewed with patient procedure-LAVH/BSO. Patient states she wants both ovaries and tubes removed even if they appear normal. All questions answered. Patient states she understands and agrees.

## 2016-03-05 NOTE — Progress Notes (Signed)
RN Wasted 23 ml of Fentanyl PCA with RN Jonna Clark in med room sink.

## 2016-03-05 NOTE — Transfer of Care (Signed)
Immediate Anesthesia Transfer of Care Note  Patient: [name]  Procedure(s) Performed: Procedure(s): LAPAROSCOPIC ASSISTED VAGINAL HYSTERECTOMY WITH BILATERAL SALPINGO OOPHORECTOMY (Bilateral)  Patient Location: PACU  Anesthesia Type:General  Level of Consciousness: awake and alert   Airway & Oxygen Therapy: Patient Spontanous Breathing and Patient connected to nasal cannula oxygen  Post-op Assessment: Report given to RN and Post -op Vital signs reviewed and stable  Post vital signs: Reviewed  Last Vitals:  Vitals:   03/05/16 0603  BP: 121/87  Pulse: 93  Resp: 18  Temp: 36.7 C    Last Pain:  Vitals:   03/05/16 0603  TempSrc: Oral      Patients Stated Pain Goal: 5 ([id number])  Complications: No apparent anesthesia complications

## 2016-03-05 NOTE — Progress Notes (Signed)
Ambulating, tolerating liquids, good pain relief  VSS Afeb  Lungs CTA Cor RRR Abdomen soft, BS+ LE PAS on  A/P: Stable         Per orders

## 2016-03-05 NOTE — Op Note (Signed)
NAMEKESHONA, PULIAFICO             ACCOUNT NO.:  [id number]  MEDICAL RECORD NO.:  [id number]  LOCATION:  [facility]                          FACILITY:  [facility]  PHYSICIAN:  Daleen Bo. Gaetano Net, M.D. DATE OF BIRTH:  01-23-1966  DATE OF PROCEDURE: DATE OF DISCHARGE:                              OPERATIVE REPORT   PREOPERATIVE DIAGNOSIS:  Menorrhagia.  POSTOPERATIVE DIAGNOSIS:  Menorrhagia.  PROCEDURE:  Laparoscopically-assisted vaginal hysterectomy with bilateral salpingo-oophorectomy.  SURGEON:  Daleen Bo. Gaetano Net, MD.  ASSISTANT:  Marylynn Pearson, MD.  ESTIMATED BLOOD LOSS:  150 mL.  SPECIMEN:  Uterus, bilateral fallopian tubes and ovaries to Pathology.  INDICATIONS AND CONSENT:  This patient is a 50 year old patient with heavy menses.  Details are dictated in history and physical. Laparoscopically assisted vaginal hysterectomy with bilateral salpingo- oophorectomy has been discussed preoperatively.  Potential risks and complications have been discussed preoperatively including, but not limited to, infection, organ damage, bleeding requiring transfusion of blood products with HIV and hepatitis acquisition, DVT, PE, pneumonia, laparotomy, fistula formation, abdominopelvic pain, painful intercourse, and return to the OR.  Issues of menopause have also been reviewed.  All questions have been answered.  The patient states she understands and agrees and consent was signed on the chart.  DESCRIPTION OF PROCEDURE:  The patient was taken to the operating room. She was identified, placed in dorsal supine position, and general anesthesia was induced via endotracheal intubation.  She was placed in a dorsal lithotomy position.  Time-out undertaken.  She was prepped abdominally and vaginally.  Bladder straight catheterized.  Hulka tenaculum was placed in the uterus as a manipulator, and she was draped in sterile fashion.  The infraumbilical and suprapubic areas were injected with approximately 7 mL  of 0.5% plain Marcaine.  Small infraumbilical incision was made.  Disposable Veress needle was placed on the first attempt without difficulty.  Good syringe and drop test were noted.  2 L of gas were then insufflated under low pressure with good tympany in the right upper quadrant.  Veress needle was removed and a 10-11 Xcel bladeless disposable trocar sleeve was placed using direct visualization with the diagnostic scope.  The operative scope was then used.  A small suprapubic incision was made and a 5-mm disposable trocar sleeve was placed under direct visualization without difficulty.  The uterus was retroverted, 8 weeks in size, smooth in contour.  Anterior and posterior cul-de-sacs were normal.  Tubes and ovaries were normal. The right infundibulopelvic ligament was taken down using the Enseal bipolar cautery cutting instrument.  This was taken across below the ovary across the round ligament down the level of the vesicouterine peritoneum.  Similar procedure was carried out on the left side.  The vesicouterine peritoneum was taken down cephalad laterally.  Good hemostasis was noted.  Instruments were removed.  Suprapubic trocar sleeve was removed and attention was turned to the vagina.  Posterior cul-de-sac was entered sharply and the cervix was circumscribed with unipolar cautery.  Mucosa was advanced sharply and bluntly.  Then using the handheld LigaSure bipolar cautery, the uterosacral ligaments were taken down followed by the bladder pillars, cardinal ligaments, uterine vessels bilaterally.  Anterior cul-de-sacs entered without difficulty. Fundus, tubes, and ovaries  were delivered posteriorly, remaining pedicles taken down.  The specimen was delivered.  Suture will be 0 Monocryl unless otherwise designated.  Uterosacral ligaments were plicated to the cuff bilaterally and then in the midline with a third suture.  Cuff was closed with figure-of-eights.  Foley catheter was placed in  the bladder and clear urine was noted.  Attention was returned to the abdomen.  Pneumoperitoneum was reintroduced and the suprapubic trocar sleeve was reintroduced under direct visualization without difficulty.  Bipolar cautery was used to assure hemostasis at peritoneal edges.  Excellent hemostasis was noted.  The remaining approximately 23 mL of 0.5% plain Marcaine was instilled in the peritoneal cavity. Suprapubic trocar sleeve was removed.  Pneumoperitoneum was reduced and the umbilical trocar sleeve was removed.  0 Vicryl was used to close the subcutaneous layer of the umbilical incision under good visualization. Skin was closed on both incisions with interrupted 4-0 Vicryl. Dermabond was placed.  All counts were correct.  The patient was awaken and taken to recovery room in stable condition.     Daleen Bo Gaetano Net, M.D.     JET/MEDQ  D:  03/05/2016  T:  03/05/2016  Job:  [id number]

## 2016-03-06 DIAGNOSIS — E119 Type 2 diabetes mellitus without complications: Secondary | ICD-10-CM | POA: Diagnosis not present

## 2016-03-06 DIAGNOSIS — D251 Intramural leiomyoma of uterus: Secondary | ICD-10-CM | POA: Diagnosis not present

## 2016-03-06 DIAGNOSIS — N83201 Unspecified ovarian cyst, right side: Secondary | ICD-10-CM | POA: Diagnosis not present

## 2016-03-06 DIAGNOSIS — E669 Obesity, unspecified: Secondary | ICD-10-CM | POA: Diagnosis not present

## 2016-03-06 DIAGNOSIS — Z6834 Body mass index (BMI) 34.0-34.9, adult: Secondary | ICD-10-CM | POA: Diagnosis not present

## 2016-03-06 DIAGNOSIS — K219 Gastro-esophageal reflux disease without esophagitis: Secondary | ICD-10-CM | POA: Diagnosis not present

## 2016-03-06 DIAGNOSIS — N83202 Unspecified ovarian cyst, left side: Secondary | ICD-10-CM | POA: Diagnosis not present

## 2016-03-06 DIAGNOSIS — Z880 Allergy status to penicillin: Secondary | ICD-10-CM | POA: Diagnosis not present

## 2016-03-06 DIAGNOSIS — N920 Excessive and frequent menstruation with regular cycle: Secondary | ICD-10-CM | POA: Diagnosis not present

## 2016-03-06 LAB — CBC
HCT: 31.8 % — ABNORMAL LOW (ref 36.0–46.0)
HEMOGLOBIN: 10.7 g/dL — AB (ref 12.0–15.0)
MCH: 29.1 pg (ref 26.0–34.0)
MCHC: 33.6 g/dL (ref 30.0–36.0)
MCV: 86.4 fL (ref 78.0–100.0)
PLATELETS: 317 10*3/uL (ref 150–400)
RBC: 3.68 MIL/uL — ABNORMAL LOW (ref 3.87–5.11)
RDW: 13.1 % (ref 11.5–15.5)
WBC: 11.2 10*3/uL — ABNORMAL HIGH (ref 4.0–10.5)

## 2016-03-06 MED ORDER — TRAMADOL HCL 50 MG PO TABS: 50.0000 mg | ORAL_TABLET | Freq: Four times a day (QID) | ORAL | 0 refills | Status: DC | PRN

## 2016-03-06 MED ORDER — IBUPROFEN 600 MG PO TABS: 600.0000 mg | ORAL_TABLET | Freq: Four times a day (QID) | ORAL | 0 refills | Status: DC | PRN

## 2016-03-06 MED FILL — traMADol HCL 50 MG TABS: 50 | 4 days supply | Qty: 20 | Fill #0

## 2016-03-06 MED FILL — IBUPROFEN 600 MG TABLET: 600 | 7 days supply | Qty: 30 | Fill #0

## 2016-03-06 NOTE — Discharge Summary (Signed)
Physician Discharge Summary  Patient ID: [name] MRN: [id number] DOB/AGE: October 29, 1965 50 y.o.  Admit date: 03/05/2016 Discharge date: 03/06/2016  Admission Diagnoses:Menorrhagia  Discharge Diagnoses:  Active Problems:   Menorrhagia   Discharged Condition: good  Hospital Course: good resumption of GI/GU function, ambulating, tolerating regular diet with good pain relief.  Consults: None  Significant Diagnostic Studies: labs:  Results for orders placed or performed during the hospital encounter of 03/05/16 (from the past 24 hour(s))  CBC     Status: Abnormal   Collection Time: 03/06/16  5:24 AM  Result Value Ref Range   WBC 11.2 (H) 4.0 - 10.5 K/uL   RBC 3.68 (L) 3.87 - 5.11 MIL/uL   Hemoglobin 10.7 (L) 12.0 - 15.0 g/dL   HCT 31.8 (L) 36.0 - 46.0 %   MCV 86.4 78.0 - 100.0 fL   MCH 29.1 26.0 - 34.0 pg   MCHC 33.6 30.0 - 36.0 g/dL   RDW 13.1 11.5 - 15.5 %   Platelets 317 150 - 400 K/uL    Treatments: surgery: LAVH/BSO  Discharge Exam: Blood pressure 134/69, pulse 70, temperature 98.1 F (36.7 C), temperature source Oral, resp. rate 16, last menstrual period 02/26/2016, SpO2 99 %. General appearance: alert, cooperative and no distress GI: soft, non-tender; bowel sounds normal; no masses,  no organomegaly  Disposition: 01-Home or Self Care     Medication List    STOP taking these medications   omeprazole 40 MG capsule Commonly known as:  PRILOSEC   sodium bicarbonate 650 MG tablet     TAKE these medications   albuterol 108 (90 Base) MCG/ACT inhaler Commonly known as:  PROVENTIL HFA;VENTOLIN HFA Inhale 2 puffs into the lungs every 6 (six) hours as needed.   ALPRAZolam 0.25 MG tablet Commonly known as:  XANAX Take 0.25 mg by mouth at bedtime as needed for anxiety.   escitalopram 20 MG tablet Commonly known as:  LEXAPRO Take 40 mg by mouth daily.   fexofenadine 180 MG tablet Commonly known as:  ALLEGRA Take 180 mg by mouth daily.    fluticasone-salmeterol 115-21 MCG/ACT inhaler Commonly known as:  ADVAIR HFA Inhale 2 puffs into the lungs 2 (two) times daily.   ibuprofen 600 MG tablet Commonly known as:  ADVIL,MOTRIN Take 1 tablet (600 mg total) by mouth every 6 (six) hours as needed (mild pain).   traMADol 50 MG tablet Commonly known as:  ULTRAM Take 1 tablet (50 mg total) by mouth every 6 (six) hours as needed for moderate pain.        Signed: Akaylah Lalley Tyler,[name] 03/06/2016, 11:30 AM

## 2016-03-06 NOTE — Discharge Instructions (Signed)
No vaginal entry °No heavy lifting °No operation of automobiles °

## 2016-03-06 NOTE — Progress Notes (Signed)
POD #1 Tolerating regular diet, + flatus, voiding, good pain relief  VSS Afeb Lung CTA Cor RRR Abdomen soft, BS+  Results for orders placed or performed during the hospital encounter of 03/05/16 (from the past 24 hour(s))  CBC     Status: Abnormal   Collection Time: 03/06/16  5:24 AM  Result Value Ref Range   WBC 11.2 (H) 4.0 - 10.5 K/uL   RBC 3.68 (L) 3.87 - 5.11 MIL/uL   Hemoglobin 10.7 (L) 12.0 - 15.0 g/dL   HCT 31.8 (L) 36.0 - 46.0 %   MCV 86.4 78.0 - 100.0 fL   MCH 29.1 26.0 - 34.0 pg   MCHC 33.6 30.0 - 36.0 g/dL   RDW 13.1 11.5 - 15.5 %   Platelets 317 150 - 400 K/uL   A/P Satisfactory        D/C home        Instructions given

## 2016-03-11 ENCOUNTER — Encounter (HOSPITAL_COMMUNITY): Payer: Self-pay | Admitting: Obstetrics and Gynecology

## 2016-03-25 MED FILL — OMEPRAZOLE DR 40 MG CAPSULE: 40 | 30 days supply | Qty: 30 | Fill #1

## 2016-03-27 DIAGNOSIS — J45909 Unspecified asthma, uncomplicated: Secondary | ICD-10-CM | POA: Diagnosis not present

## 2016-03-27 DIAGNOSIS — F322 Major depressive disorder, single episode, severe without psychotic features: Secondary | ICD-10-CM | POA: Diagnosis not present

## 2016-03-27 DIAGNOSIS — E11319 Type 2 diabetes mellitus with unspecified diabetic retinopathy without macular edema: Secondary | ICD-10-CM | POA: Diagnosis not present

## 2016-03-27 DIAGNOSIS — K219 Gastro-esophageal reflux disease without esophagitis: Secondary | ICD-10-CM | POA: Diagnosis not present

## 2016-03-27 DIAGNOSIS — Z23 Encounter for immunization: Secondary | ICD-10-CM | POA: Diagnosis not present

## 2016-03-27 DIAGNOSIS — Z0000 Encounter for general adult medical examination without abnormal findings: Secondary | ICD-10-CM | POA: Diagnosis not present

## 2016-03-27 DIAGNOSIS — E7800 Pure hypercholesterolemia, unspecified: Secondary | ICD-10-CM | POA: Diagnosis not present

## 2016-03-27 DIAGNOSIS — E119 Type 2 diabetes mellitus without complications: Secondary | ICD-10-CM | POA: Diagnosis not present

## 2016-03-29 MED FILL — LISINOPRIL 10 MG TABLET: 10 | 30 days supply | Qty: 30 | Fill #0

## 2016-04-11 ENCOUNTER — Encounter: Payer: Self-pay | Admitting: Gastroenterology

## 2016-04-11 ENCOUNTER — Encounter (INDEPENDENT_AMBULATORY_CARE_PROVIDER_SITE_OTHER): Payer: Self-pay

## 2016-04-11 ENCOUNTER — Ambulatory Visit (INDEPENDENT_AMBULATORY_CARE_PROVIDER_SITE_OTHER): Payer: 59 | Admitting: Gastroenterology

## 2016-04-11 VITALS — BP 102/72 | HR 88 | Ht 63.0 in | Wt 187.1 lb

## 2016-04-11 DIAGNOSIS — K222 Esophageal obstruction: Secondary | ICD-10-CM

## 2016-04-11 DIAGNOSIS — K219 Gastro-esophageal reflux disease without esophagitis: Secondary | ICD-10-CM | POA: Diagnosis not present

## 2016-04-11 MED ORDER — OMEPRAZOLE 40 MG PO CPDR: 40.0000 mg | DELAYED_RELEASE_CAPSULE | Freq: Every day | ORAL | 6 refills | Status: AC

## 2016-04-11 NOTE — Progress Notes (Addendum)
[facility] Progress Note  Chief Complaint: GERD   Subjective  History:  [name] follows up for the first time in almost 2 years. She previously saw Dr. Deatra Ina and our [place] for GERD and dysphagia. Upper endoscopy in 2010 revealed a small hiatal hernia, esophageal stricture which was dilated, and no erosive esophagitis. She was maintained on Zegerid until they no longer made it, now she takes omeprazole 40 mg once daily along with sodium bicarbonate 650 mg. If she takes her medicines, she feels well, with infrequent episodes of pyrosis. However, she still has nocturnal episodes of regurgitation at times feelings of aspiration. [name] typically takes her medicine in the morning. She denies dysphagia. For the last couple of years she also tends to the need for a bowel movement about 30-40 minutes after meal, but the stool is formed and without blood. She is recovering from a hysterectomy done 5 weeks ago.  ROS: Cardiovascular:  no chest pain Respiratory: no dyspnea  The patient's Past Medical, Family and Social History were reviewed and are on file in the EMR.  Objective:  Med list reviewed  Vital signs in last 24 hrs: Vitals:   04/11/16 0928  BP: 102/72  Pulse: 88    Physical Exam  HEENT: sclera anicteric, oral mucosa moist without lesions Neck: supple, no thyromegaly, JVD or lymphadenopathy Cardiac: RRR without murmurs, S1S2 heard, no peripheral edema Pulm: clear to auscultation bilaterally, normal RR and effort noted Abdomen: soft,Mild LLQ tenderness, with active bowel sounds. No guarding or palpable hepatosplenomegaly. Skin; warm and dry, no jaundice or rash   @ASSESSMENTPLANBEGIN @ Assessment: Encounter Diagnoses  Name Primary?   Gastroesophageal reflux disease without esophagitis Yes   Esophageal stricture    No current dysphagia. She has breakthrough symptoms especially at night, and we discussed the limitation of even potent acid suppression because of nonacid  reflux. Diet and lifestyle measures such as elevating the head of bed, not eating within a few hours of bedtime and maintaining healthy weight are all reinforced. I asked whether she had ever considered a fundoplication, she is familiar with that because apparently her daughter needed in childhood due to severe reflux. [name] has considered it, but doesn't feel she is ready to do so at this point in her life. We also discussed recent suggestions in the medical literature about possible long-term, he is a PPI use, and how they're seem to be associations but not clearly causality at this point. Overall, I think the benefits outweigh the risk in this patient with breakthrough nocturnal GERD symptoms.  Plan:  Omeprazole 40 mg, redosed to before supper meal. Stop daily use of sodium bicarbonate, she can just use it as needed for breakthrough heartburn or take Tums instead If that does not seem to be helping, we can try different PPI See [place] in a year or sooner as needed. Screening colonoscopy at age 50 Total time 20 minutes, over half spent in counseling and coordination of care.   [name] III

## 2016-04-11 NOTE — Patient Instructions (Signed)
If you are age 48 or older, your body mass index should be between 23-30. Your Body mass index is 33.15 kg/m. If this is out of the aforementioned range listed, please consider follow up with your Primary Care Provider.  If you are age 59 or younger, your body mass index should be between 19-25. Your Body mass index is 33.15 kg/m. If this is out of the aformentioned range listed, please consider follow up with your Primary Care Provider.   Thank you for choosing [facility]  Dr Wilfrid Lund III

## 2016-04-24 MED FILL — OMEPRAZOLE DR 40 MG CAPSULE: 40 | 30 days supply | Qty: 30 | Fill #0

## 2016-05-16 MED FILL — ATORVASTATIN 20 MG TABLET: 20 | 30 days supply | Qty: 30 | Fill #1

## 2016-05-16 MED FILL — ESCITALOPRAM 20 MG TABLET: 20 | 90 days supply | Qty: 180 | Fill #2

## 2016-05-16 MED FILL — LISINOPRIL 10 MG TABLET: 10 | 30 days supply | Qty: 30 | Fill #1

## 2016-05-16 MED FILL — OMEPRAZOLE DR 40 MG CAPSULE: 40 | 30 days supply | Qty: 30 | Fill #1

## 2016-07-11 ENCOUNTER — Emergency Department (HOSPITAL_COMMUNITY): Payer: 59

## 2016-07-11 ENCOUNTER — Emergency Department (HOSPITAL_COMMUNITY)
Admission: EM | Admit: 2016-07-11 | Discharge: 2016-07-11 | Disposition: A | Discharge status: Home / Self Care | Payer: 59 | Attending: Emergency Medicine | Admitting: Emergency Medicine

## 2016-07-11 ENCOUNTER — Encounter (HOSPITAL_COMMUNITY): Payer: Self-pay | Admitting: Vascular Surgery

## 2016-07-11 DIAGNOSIS — E119 Type 2 diabetes mellitus without complications: Secondary | ICD-10-CM | POA: Insufficient documentation

## 2016-07-11 DIAGNOSIS — J45909 Unspecified asthma, uncomplicated: Secondary | ICD-10-CM | POA: Insufficient documentation

## 2016-07-11 DIAGNOSIS — R0602 Shortness of breath: Secondary | ICD-10-CM | POA: Diagnosis not present

## 2016-07-11 DIAGNOSIS — J181 Lobar pneumonia, unspecified organism: Secondary | ICD-10-CM | POA: Insufficient documentation

## 2016-07-11 DIAGNOSIS — J189 Pneumonia, unspecified organism: Secondary | ICD-10-CM

## 2016-07-11 LAB — COMPREHENSIVE METABOLIC PANEL
ALK PHOS: 76 U/L (ref 38–126)
ALT: 48 U/L (ref 14–54)
AST: 30 U/L (ref 15–41)
Albumin: 3.9 g/dL (ref 3.5–5.0)
Anion gap: 10 (ref 5–15)
BUN: 10 mg/dL (ref 6–20)
CALCIUM: 9.5 mg/dL (ref 8.9–10.3)
CO2: 27 mmol/L (ref 22–32)
CREATININE: 0.67 mg/dL (ref 0.44–1.00)
Chloride: 99 mmol/L — ABNORMAL LOW (ref 101–111)
Glucose, Bld: 116 mg/dL — ABNORMAL HIGH (ref 65–99)
Potassium: 3.7 mmol/L (ref 3.5–5.1)
Sodium: 136 mmol/L (ref 135–145)
Total Bilirubin: 0.9 mg/dL (ref 0.3–1.2)
Total Protein: 7.3 g/dL (ref 6.5–8.1)

## 2016-07-11 LAB — URINALYSIS, ROUTINE W REFLEX MICROSCOPIC
BILIRUBIN URINE: NEGATIVE
Glucose, UA: NEGATIVE mg/dL
HGB URINE DIPSTICK: NEGATIVE
KETONES UR: NEGATIVE mg/dL
Nitrite: NEGATIVE
PROTEIN: 30 mg/dL — AB
Specific Gravity, Urine: 1.018 (ref 1.005–1.030)
pH: 6.5 (ref 5.0–8.0)

## 2016-07-11 LAB — CBC
HCT: 39 % (ref 36.0–46.0)
Hemoglobin: 13.3 g/dL (ref 12.0–15.0)
MCH: 29.4 pg (ref 26.0–34.0)
MCHC: 34.1 g/dL (ref 30.0–36.0)
MCV: 86.1 fL (ref 78.0–100.0)
PLATELETS: 282 10*3/uL (ref 150–400)
RBC: 4.53 MIL/uL (ref 3.87–5.11)
RDW: 13.3 % (ref 11.5–15.5)
WBC: 5.8 10*3/uL (ref 4.0–10.5)

## 2016-07-11 LAB — URINE MICROSCOPIC-ADD ON: RBC / HPF: NONE SEEN RBC/hpf (ref 0–5)

## 2016-07-11 LAB — LIPASE, BLOOD: Lipase: 18 U/L (ref 11–51)

## 2016-07-11 MED ORDER — ONDANSETRON 4 MG PO TBDP
4.0000 mg | ORAL_TABLET | Freq: Once | ORAL | Status: AC | PRN
  Administered 2016-07-11: 4 mg via ORAL

## 2016-07-11 MED ORDER — ONDANSETRON 4 MG PO TBDP
ORAL_TABLET | ORAL | Status: AC
  Filled 2016-07-11: qty 1

## 2016-07-11 MED ORDER — HYDROCOD POLST-CPM POLST ER 10-8 MG/5ML PO SUER: 5.0000 mL | Freq: Two times a day (BID) | ORAL | 0 refills | Status: DC

## 2016-07-11 MED ORDER — AZITHROMYCIN 250 MG PO TABS: 250.0000 mg | ORAL_TABLET | Freq: Every day | ORAL | 0 refills | Status: DC

## 2016-07-11 MED ORDER — ONDANSETRON 8 MG PO TBDP: 8.0000 mg | ORAL_TABLET | Freq: Three times a day (TID) | ORAL | 0 refills | Status: DC | PRN

## 2016-07-11 MED FILL — ONDANSETRON ODT 8 MG TABLET: 8 | 7 days supply | Qty: 20 | Fill #0

## 2016-07-11 MED FILL — AZITHROMYCIN 250 MG TABLET: 250 | 5 days supply | Qty: 6 | Fill #0

## 2016-07-11 MED FILL — HYDROCODONE-CHLORPHENIRAM S: 10-8 | 14 days supply | Qty: 140 | Fill #0

## 2016-07-11 NOTE — ED Triage Notes (Signed)
Pt reports to the ED for eval of flu-like symptoms. She has been having a dry cough, nasal congestion, sinus pressure since Monday and yesterday she developed some N/V/D. Pt was seen by an RN at her work and was told she needed to come here because she is dehydrated.

## 2016-07-11 NOTE — Discharge Planning (Signed)
Pt up for discharge. EDCM reviewed chart for possible CM needs.  No needs identified or communicated.  

## 2016-07-11 NOTE — ED Provider Notes (Signed)
[facility] DEPT Provider Note   CSN: [id number] Arrival date & time: 07/11/16  1255  By signing my name below, I, Julien Nordmann, attest that this documentation has been prepared under the direction and in the presence of Lacretia Leigh, MD.  Electronically Signed: Julien Nordmann, ED Scribe. 07/11/16. 2:39 PM.    History   Chief Complaint Chief Complaint  Patient presents with  . Influenza    The history is provided by the patient. No language interpreter was used.   HPI Comments: [name] is a 50 y.o. female who has a PMhx of DM and HLD presents to the Emergency Department complaining of generalized flu-like symptoms x 1 week. She reports associated weakness, fatigue, cough, diarrhea, generalized body aches, mild headache, chills, and vomiting x 1. Pt has been voiding bright yellow urine. She notes that she has only been able to keep water down. Pt was evaluated by a nurse at her job and was told to come to the ED for being possibly dehydrated. She has received her flu shot. Pt has not taken any OTC medications to relieve her symptoms. She denies fever, neck pain, and photophobia.   Past Medical History:  Diagnosis Date  . Anemia   . Asthma   . Basal cell carcinoma   . Complication of anesthesia    hard time waking up afterwards  . Diabetes mellitus without complication (HCC)    diet controlled  . GERD (gastroesophageal reflux disease)   . Hyperlipidemia     Patient Active Problem List   Diagnosis Date Noted  . Menorrhagia 03/05/2016  . Abdominal pain, right lower quadrant 05/04/2012  . ESOPHAGEAL REFLUX 05/03/2009  . Dysphagia 05/03/2009    Past Surgical History:  Procedure Laterality Date  . CHOLECYSTECTOMY    . LAPAROSCOPIC VAGINAL HYSTERECTOMY WITH SALPINGO OOPHORECTOMY Bilateral 03/05/2016   Procedure: LAPAROSCOPIC ASSISTED VAGINAL HYSTERECTOMY WITH BILATERAL SALPINGO OOPHORECTOMY;  Surgeon: Everlene Farrier, MD;  Location: [facility];  Service: Gynecology;   Laterality: Bilateral;  . ROTATOR CUFF REPAIR     right    OB History    No data available       Home Medications    Prior to Admission medications   Medication Sig Start Date End Date Taking? Authorizing Provider  albuterol (PROVENTIL HFA;VENTOLIN HFA) 108 (90 BASE) MCG/ACT inhaler Inhale 2 puffs into the lungs every 6 (six) hours as needed.      Historical Provider, MD  ALPRAZolam Duanne Moron) 0.25 MG tablet Take 0.25 mg by mouth at bedtime as needed for anxiety.    Historical Provider, MD  atorvastatin (LIPITOR) 20 MG tablet Take 20 mg by mouth daily.    Historical Provider, MD  escitalopram (LEXAPRO) 20 MG tablet Take 40 mg by mouth daily.    Historical Provider, MD  fexofenadine (ALLEGRA) 180 MG tablet Take 180 mg by mouth daily.      Historical Provider, MD  fluticasone-salmeterol (ADVAIR HFA) 115-21 MCG/ACT inhaler Inhale 2 puffs into the lungs 2 (two) times daily.      Historical Provider, MD  lisinopril (PRINIVIL,ZESTRIL) 10 MG tablet Take 10 mg by mouth daily.    Historical Provider, MD  omeprazole (PRILOSEC) 40 MG capsule Take 1 capsule (40 mg total) by mouth daily. 04/11/16   Nelida Meuse III, MD  sodium bicarbonate 650 MG tablet Take 1,300 mg by mouth daily.    Historical Provider, MD    Family History Family History  Problem Relation Age of Onset  . Diabetes  mat. side, diet controlled  . Lung cancer Mother     non-smoker, ? original site  . Colon cancer Neg Hx     Social History Social History  Substance Use Topics  . Smoking status: Never Smoker  . Smokeless tobacco: Never Used  . Alcohol use Yes     Comment: rare     Allergies   Codeine and Penicillins   Review of Systems Review of Systems  Constitutional: Positive for appetite change, chills and fatigue. Negative for fever.  Eyes: Negative for photophobia.  Respiratory: Positive for cough.   Gastrointestinal: Positive for diarrhea, nausea and vomiting.  Musculoskeletal: Positive for myalgias.  Negative for neck pain.  Neurological: Positive for weakness and headaches.  All other systems reviewed and are negative.    Physical Exam Updated Vital Signs BP 134/79 (BP Location: Left Arm)   Pulse 96   Temp 98.9 F (37.2 C) (Oral)   Resp 16   LMP 02/26/2016 (Exact Date)   SpO2 99%   Physical Exam  Constitutional: She is oriented to person, place, and time. She appears well-developed and well-nourished.  Non-toxic appearance. No distress.  HENT:  Head: Normocephalic and atraumatic.  Moist mucus membranes  Eyes: Conjunctivae, EOM and lids are normal. Pupils are equal, round, and reactive to light.  Neck: Normal range of motion. Neck supple. No tracheal deviation present. No thyroid mass present.  Cardiovascular: Normal rate, regular rhythm and normal heart sounds.  Exam reveals no gallop.   No murmur heard. Pulmonary/Chest: Effort normal and breath sounds normal. No stridor. No respiratory distress. She has no decreased breath sounds. She has no wheezes. She has no rhonchi. She has no rales.  Abdominal: Soft. Normal appearance and bowel sounds are normal. She exhibits no distension. There is no tenderness. There is no rebound and no CVA tenderness.  Musculoskeletal: Normal range of motion. She exhibits no edema or tenderness.  Neurological: She is alert and oriented to person, place, and time. She has normal strength. No cranial nerve deficit or sensory deficit. GCS eye subscore is 4. GCS verbal subscore is 5. GCS motor subscore is 6.  Skin: Skin is warm and dry. No abrasion and no rash noted.  Psychiatric: She has a normal mood and affect. Her speech is normal and behavior is normal.  Nursing note and vitals reviewed.    ED Treatments / Results  DIAGNOSTIC STUDIES: Oxygen Saturation is 99% on RA, normal by my interpretation.  COORDINATION OF CARE:  2:37 PM Discussed treatment plan with pt at bedside and pt agreed to plan.  Labs (all labs ordered are listed, but only  abnormal results are displayed) Labs Reviewed  CBC  LIPASE, BLOOD  COMPREHENSIVE METABOLIC PANEL  URINALYSIS, ROUTINE W REFLEX MICROSCOPIC (NOT AT [facility])    EKG  EKG Interpretation None       Radiology No results found.  Procedures Procedures (including critical care time)  Medications Ordered in ED Medications  ondansetron (ZOFRAN-ODT) 4 MG disintegrating tablet (not administered)  ondansetron (ZOFRAN-ODT) disintegrating tablet 4 mg (4 mg Oral Given 07/11/16 1324)     Initial Impression / Assessment and Plan / ED Course  I have reviewed the triage vital signs and the nursing notes.  Pertinent labs & imaging results that were available during my care of the patient were reviewed by me and considered in my medical decision making (see chart for details).  Clinical Course     I personally performed the services described in this documentation, which was  scribed in my presence. The recorded information has been reviewed and is accurate.   Patient to be placed on antibiotics. Given Zofran here. At her. Take in oral fluids well. Final Clinical Impressions(s) / ED Diagnoses   Final diagnoses:  SOB (shortness of breath)    New Prescriptions New Prescriptions   No medications on file     Lacretia Leigh, MD 07/11/16 1536

## 2016-07-11 NOTE — ED Notes (Signed)
Pt is in stable condition upon d/c and ambulates from ED. 

## 2016-07-18 MED FILL — OMEPRAZOLE DR 40 MG CAPSULE: 40 | 30 days supply | Qty: 30 | Fill #2

## 2016-08-06 MED FILL — ESCITALOPRAM 20 MG TABLET: 20 | 90 days supply | Qty: 180 | Fill #3

## 2016-08-21 MED FILL — OMEPRAZOLE DR 40 MG CAPSULE: 40 | 30 days supply | Qty: 30 | Fill #3

## 2016-09-20 MED FILL — OMEPRAZOLE DR 40 MG CAPSULE: 40 | 30 days supply | Qty: 30 | Fill #4

## 2016-10-01 DIAGNOSIS — E11319 Type 2 diabetes mellitus with unspecified diabetic retinopathy without macular edema: Secondary | ICD-10-CM | POA: Diagnosis not present

## 2016-10-01 DIAGNOSIS — E7800 Pure hypercholesterolemia, unspecified: Secondary | ICD-10-CM | POA: Diagnosis not present

## 2016-10-01 DIAGNOSIS — K219 Gastro-esophageal reflux disease without esophagitis: Secondary | ICD-10-CM | POA: Diagnosis not present

## 2016-10-01 DIAGNOSIS — E119 Type 2 diabetes mellitus without complications: Secondary | ICD-10-CM | POA: Diagnosis not present

## 2016-10-01 DIAGNOSIS — F322 Major depressive disorder, single episode, severe without psychotic features: Secondary | ICD-10-CM | POA: Diagnosis not present

## 2016-10-01 MED FILL — ROSUVASTATIN CALCIUM 5 MG T: 5 | 30 days supply | Qty: 30 | Fill #0

## 2016-10-01 MED FILL — ADVAIR 250/50 DISKUS: 250-50 | 30 days supply | Qty: 60 | Fill #0

## 2016-10-01 MED FILL — LOSARTAN POTASSIUM 25 MG TA: 25 | 30 days supply | Qty: 30 | Fill #0

## 2016-10-01 MED FILL — VENTOLIN HFA 90 MCG INHALER: 108 (90 BAS | 17 days supply | Qty: 18 | Fill #0

## 2016-10-17 MED FILL — ESCITALOPRAM 20 MG TABLET: 20 | 90 days supply | Qty: 180 | Fill #0

## 2016-10-17 MED FILL — OMEPRAZOLE DR 40 MG CAPSULE: 40 | 30 days supply | Qty: 30 | Fill #5

## 2016-11-07 MED FILL — LOSARTAN POTASSIUM 25 MG TA: 25 | 30 days supply | Qty: 30 | Fill #1

## 2016-11-07 MED FILL — ROSUVASTATIN CALCIUM 5 MG T: 5 | 30 days supply | Qty: 30 | Fill #1

## 2016-11-21 MED FILL — metFORMIN HCL 500 MG TABS: 500 | 60 days supply | Qty: 60 | Fill #0

## 2016-12-05 DIAGNOSIS — H5213 Myopia, bilateral: Secondary | ICD-10-CM | POA: Diagnosis not present

## 2016-12-31 MED FILL — LOSARTAN POTASSIUM 25 MG TA: 25 | 30 days supply | Qty: 30 | Fill #2

## 2016-12-31 MED FILL — OMEPRAZOLE DR 40 MG CAPSULE: 40 | 30 days supply | Qty: 30 | Fill #6

## 2016-12-31 MED FILL — ROSUVASTATIN CALCIUM 5 MG T: 5 | 30 days supply | Qty: 30 | Fill #2

## 2017-01-01 DIAGNOSIS — Z7984 Long term (current) use of oral hypoglycemic drugs: Secondary | ICD-10-CM | POA: Diagnosis not present

## 2017-01-01 DIAGNOSIS — E119 Type 2 diabetes mellitus without complications: Secondary | ICD-10-CM | POA: Diagnosis not present

## 2017-01-17 DIAGNOSIS — Z1231 Encounter for screening mammogram for malignant neoplasm of breast: Secondary | ICD-10-CM | POA: Diagnosis not present

## 2017-01-17 DIAGNOSIS — Z01419 Encounter for gynecological examination (general) (routine) without abnormal findings: Secondary | ICD-10-CM | POA: Diagnosis not present

## 2017-01-17 DIAGNOSIS — Z6834 Body mass index (BMI) 34.0-34.9, adult: Secondary | ICD-10-CM | POA: Diagnosis not present

## 2017-01-24 DIAGNOSIS — N958 Other specified menopausal and perimenopausal disorders: Secondary | ICD-10-CM | POA: Diagnosis not present

## 2017-01-24 DIAGNOSIS — Z13820 Encounter for screening for osteoporosis: Secondary | ICD-10-CM | POA: Diagnosis not present

## 2017-02-06 ENCOUNTER — Other Ambulatory Visit: Payer: Self-pay | Admitting: Gastroenterology

## 2017-02-06 MED FILL — ROSUVASTATIN CALCIUM 5 MG T: 5 | 30 days supply | Qty: 30 | Fill #3

## 2017-02-06 MED FILL — OMEPRAZOLE DR 40 MG CAPSULE: 40 | 30 days supply | Qty: 30 | Fill #0

## 2017-02-06 MED FILL — LOSARTAN POTASSIUM 25 MG TA: 25 | 30 days supply | Qty: 30 | Fill #3

## 2017-02-06 NOTE — Telephone Encounter (Signed)
Pt contacted to scheduled a yearly follow up for refills on her omeprazole. She declines a follow up at this time and will contact her primary doctor to take over the refills.

## 2017-02-10 ENCOUNTER — Other Ambulatory Visit: Payer: Self-pay | Admitting: Gastroenterology

## 2017-03-25 MED FILL — OMEPRAZOLE DR 40 MG CAPSULE: 40 | 30 days supply | Qty: 30 | Fill #1

## 2017-04-01 DIAGNOSIS — J45909 Unspecified asthma, uncomplicated: Secondary | ICD-10-CM | POA: Diagnosis not present

## 2017-04-01 DIAGNOSIS — F322 Major depressive disorder, single episode, severe without psychotic features: Secondary | ICD-10-CM | POA: Diagnosis not present

## 2017-04-01 DIAGNOSIS — E1165 Type 2 diabetes mellitus with hyperglycemia: Secondary | ICD-10-CM | POA: Diagnosis not present

## 2017-04-01 DIAGNOSIS — Z0000 Encounter for general adult medical examination without abnormal findings: Secondary | ICD-10-CM | POA: Diagnosis not present

## 2017-04-01 DIAGNOSIS — E7800 Pure hypercholesterolemia, unspecified: Secondary | ICD-10-CM | POA: Diagnosis not present

## 2017-04-01 DIAGNOSIS — K219 Gastro-esophageal reflux disease without esophagitis: Secondary | ICD-10-CM | POA: Diagnosis not present

## 2017-04-01 MED FILL — DULoxetine HCL 30 MG CPEP: 30 | 30 days supply | Qty: 60 | Fill #0

## 2017-05-07 MED FILL — OMEPRAZOLE DR 40 MG CAPSULE: 40 | 30 days supply | Qty: 30 | Fill #2

## 2017-05-07 MED FILL — DULoxetine HCL 30 MG CPEP: 30 | 30 days supply | Qty: 60 | Fill #1

## 2017-06-02 DIAGNOSIS — E119 Type 2 diabetes mellitus without complications: Secondary | ICD-10-CM | POA: Diagnosis not present

## 2017-06-02 DIAGNOSIS — F322 Major depressive disorder, single episode, severe without psychotic features: Secondary | ICD-10-CM | POA: Diagnosis not present

## 2017-06-02 DIAGNOSIS — Z7984 Long term (current) use of oral hypoglycemic drugs: Secondary | ICD-10-CM | POA: Diagnosis not present

## 2017-06-02 MED FILL — metFORMIN HCL 500 MG TABS: 500 | 30 days supply | Qty: 30 | Fill #0

## 2017-06-02 MED FILL — DULoxetine HCL 30 MG CPEP: 30 | 30 days supply | Qty: 60 | Fill #0

## 2017-06-11 MED FILL — OMEPRAZOLE DR 40 MG CAPSULE: 40 | 30 days supply | Qty: 30 | Fill #0

## 2017-07-17 MED FILL — DULoxetine HCL 30 MG CPEP: 30 | 30 days supply | Qty: 60 | Fill #1

## 2017-07-17 MED FILL — OMEPRAZOLE DR 40 MG CAPSULE: 40 | 30 days supply | Qty: 30 | Fill #1

## 2017-08-29 MED FILL — DULoxetine HCL 30 MG CPEP: 30 | 30 days supply | Qty: 60 | Fill #2

## 2017-08-29 MED FILL — OMEPRAZOLE DR 40 MG CAPSULE: 40 | 30 days supply | Qty: 30 | Fill #2

## 2017-09-17 DIAGNOSIS — E119 Type 2 diabetes mellitus without complications: Secondary | ICD-10-CM | POA: Diagnosis not present

## 2017-09-17 DIAGNOSIS — E11319 Type 2 diabetes mellitus with unspecified diabetic retinopathy without macular edema: Secondary | ICD-10-CM | POA: Diagnosis not present

## 2017-09-17 DIAGNOSIS — F322 Major depressive disorder, single episode, severe without psychotic features: Secondary | ICD-10-CM | POA: Diagnosis not present

## 2017-09-17 DIAGNOSIS — E7800 Pure hypercholesterolemia, unspecified: Secondary | ICD-10-CM | POA: Diagnosis not present

## 2017-09-17 DIAGNOSIS — Z7984 Long term (current) use of oral hypoglycemic drugs: Secondary | ICD-10-CM | POA: Diagnosis not present

## 2017-09-23 MED FILL — metFORMIN HCL 500 MG TABS: 500 | 30 days supply | Qty: 60 | Fill #0

## 2017-09-23 MED FILL — ROSUVASTATIN CALCIUM 10 MG: 10 | 30 days supply | Qty: 15 | Fill #0

## 2017-09-26 MED FILL — OMEPRAZOLE DR 40 MG CAPSULE: 40 | 30 days supply | Qty: 30 | Fill #0

## 2017-11-04 MED FILL — OMEPRAZOLE DR 40 MG CAPSULE: 40 | 30 days supply | Qty: 30 | Fill #1

## 2017-12-10 MED FILL — OMEPRAZOLE DR 40 MG CAPSULE: 40 | 30 days supply | Qty: 30 | Fill #2

## 2018-01-27 MED FILL — OMEPRAZOLE DR 40 MG CAPSULE: 40 | 30 days supply | Qty: 30 | Fill #0

## 2018-01-27 MED FILL — DULoxetine HCL 30 MG CPEP: 30 | 30 days supply | Qty: 60 | Fill #3

## 2018-02-25 MED FILL — OMEPRAZOLE DR 40 MG CAPSULE: 40 | 30 days supply | Qty: 30 | Fill #1

## 2018-03-27 DIAGNOSIS — E113293 Type 2 diabetes mellitus with mild nonproliferative diabetic retinopathy without macular edema, bilateral: Secondary | ICD-10-CM | POA: Diagnosis not present

## 2018-03-27 DIAGNOSIS — H5213 Myopia, bilateral: Secondary | ICD-10-CM | POA: Diagnosis not present

## 2018-03-31 MED FILL — OMEPRAZOLE 40 MG CPDR: 40 | 60 days supply | Qty: 60 | Fill #0

## 2018-04-08 DIAGNOSIS — Z86018 Personal history of other benign neoplasm: Secondary | ICD-10-CM | POA: Diagnosis not present

## 2018-04-08 DIAGNOSIS — D2230 Melanocytic nevi of unspecified part of face: Secondary | ICD-10-CM | POA: Diagnosis not present

## 2018-04-08 DIAGNOSIS — D225 Melanocytic nevi of trunk: Secondary | ICD-10-CM | POA: Diagnosis not present

## 2018-04-08 DIAGNOSIS — Z808 Family history of malignant neoplasm of other organs or systems: Secondary | ICD-10-CM | POA: Diagnosis not present

## 2018-04-08 DIAGNOSIS — D2271 Melanocytic nevi of right lower limb, including hip: Secondary | ICD-10-CM | POA: Diagnosis not present

## 2018-04-08 DIAGNOSIS — L720 Epidermal cyst: Secondary | ICD-10-CM | POA: Diagnosis not present

## 2018-04-08 DIAGNOSIS — D485 Neoplasm of uncertain behavior of skin: Secondary | ICD-10-CM | POA: Diagnosis not present

## 2018-04-08 DIAGNOSIS — D2371 Other benign neoplasm of skin of right lower limb, including hip: Secondary | ICD-10-CM | POA: Diagnosis not present

## 2018-04-08 DIAGNOSIS — D2372 Other benign neoplasm of skin of left lower limb, including hip: Secondary | ICD-10-CM | POA: Diagnosis not present

## 2018-04-08 DIAGNOSIS — D224 Melanocytic nevi of scalp and neck: Secondary | ICD-10-CM | POA: Diagnosis not present

## 2018-05-01 DIAGNOSIS — Z1231 Encounter for screening mammogram for malignant neoplasm of breast: Secondary | ICD-10-CM | POA: Diagnosis not present

## 2018-05-01 DIAGNOSIS — Z01419 Encounter for gynecological examination (general) (routine) without abnormal findings: Secondary | ICD-10-CM | POA: Diagnosis not present

## 2018-05-01 DIAGNOSIS — Z6833 Body mass index (BMI) 33.0-33.9, adult: Secondary | ICD-10-CM | POA: Diagnosis not present

## 2018-05-01 MED FILL — BETAMETHASONE VA 0.1% CREAM: 0.1 | 45 days supply | Qty: 45 | Fill #0

## 2018-06-09 MED FILL — OMEPRAZOLE 40 MG CPDR: 40 | 30 days supply | Qty: 30 | Fill #0

## 2018-07-15 MED FILL — OMEPRAZOLE 40 MG CPDR: 40 | 30 days supply | Qty: 30 | Fill #0

## 2018-08-14 MED FILL — OMEPRAZOLE 40 MG CPDR: 40 | 30 days supply | Qty: 30 | Fill #1

## 2018-09-09 MED FILL — OMEPRAZOLE 40 MG CPDR: 40 | 30 days supply | Qty: 30 | Fill #0

## 2018-10-22 MED FILL — OMEPRAZOLE 40 MG CPDR: 40 | 30 days supply | Qty: 30 | Fill #0

## 2018-11-17 MED FILL — OMEPRAZOLE 40 MG CPDR: 40 | 30 days supply | Qty: 30 | Fill #0

## 2018-12-09 MED FILL — OMEPRAZOLE 40 MG CPDR: 40 | 30 days supply | Qty: 30 | Fill #0

## 2019-01-07 MED FILL — OMEPRAZOLE 40 MG CPDR: 40 | 30 days supply | Qty: 30 | Fill #0

## 2019-02-22 MED FILL — OMEPRAZOLE 40 MG CPDR: 40 | 30 days supply | Qty: 30 | Fill #0

## 2019-03-03 DIAGNOSIS — E7800 Pure hypercholesterolemia, unspecified: Secondary | ICD-10-CM | POA: Diagnosis not present

## 2019-03-03 DIAGNOSIS — F322 Major depressive disorder, single episode, severe without psychotic features: Secondary | ICD-10-CM | POA: Diagnosis not present

## 2019-03-03 DIAGNOSIS — F419 Anxiety disorder, unspecified: Secondary | ICD-10-CM | POA: Diagnosis not present

## 2019-03-03 DIAGNOSIS — E1169 Type 2 diabetes mellitus with other specified complication: Secondary | ICD-10-CM | POA: Diagnosis not present

## 2019-03-03 DIAGNOSIS — K219 Gastro-esophageal reflux disease without esophagitis: Secondary | ICD-10-CM | POA: Diagnosis not present

## 2019-03-03 DIAGNOSIS — Z1211 Encounter for screening for malignant neoplasm of colon: Secondary | ICD-10-CM | POA: Diagnosis not present

## 2019-03-03 DIAGNOSIS — E11319 Type 2 diabetes mellitus with unspecified diabetic retinopathy without macular edema: Secondary | ICD-10-CM | POA: Diagnosis not present

## 2019-03-03 DIAGNOSIS — Z0000 Encounter for general adult medical examination without abnormal findings: Secondary | ICD-10-CM | POA: Diagnosis not present

## 2019-03-03 DIAGNOSIS — J45909 Unspecified asthma, uncomplicated: Secondary | ICD-10-CM | POA: Diagnosis not present

## 2019-03-03 MED FILL — diazePAM 5 MG TABS: 5 | 10 days supply | Qty: 10 | Fill #0

## 2019-03-09 DIAGNOSIS — E7800 Pure hypercholesterolemia, unspecified: Secondary | ICD-10-CM | POA: Diagnosis not present

## 2019-03-09 DIAGNOSIS — E1169 Type 2 diabetes mellitus with other specified complication: Secondary | ICD-10-CM | POA: Diagnosis not present

## 2019-03-10 MED FILL — JARDIANCE 10 MG TABLET: 10 | 90 days supply | Qty: 90 | Fill #0

## 2019-03-10 MED FILL — VALSARTAN 80 MG TABLET: 80 | 90 days supply | Qty: 90 | Fill #0

## 2019-03-10 MED FILL — ROSUVASTATIN CALCIUM 10 MG: 10 | 90 days supply | Qty: 45 | Fill #0

## 2019-03-30 MED FILL — OMEPRAZOLE 40 MG CPDR: 40 | 90 days supply | Qty: 90 | Fill #0

## 2019-04-09 DIAGNOSIS — D224 Melanocytic nevi of scalp and neck: Secondary | ICD-10-CM | POA: Diagnosis not present

## 2019-04-09 DIAGNOSIS — D2372 Other benign neoplasm of skin of left lower limb, including hip: Secondary | ICD-10-CM | POA: Diagnosis not present

## 2019-04-09 DIAGNOSIS — D225 Melanocytic nevi of trunk: Secondary | ICD-10-CM | POA: Diagnosis not present

## 2019-04-09 DIAGNOSIS — D2271 Melanocytic nevi of right lower limb, including hip: Secondary | ICD-10-CM | POA: Diagnosis not present

## 2019-04-09 DIAGNOSIS — Z808 Family history of malignant neoplasm of other organs or systems: Secondary | ICD-10-CM | POA: Diagnosis not present

## 2019-04-09 DIAGNOSIS — D2230 Melanocytic nevi of unspecified part of face: Secondary | ICD-10-CM | POA: Diagnosis not present

## 2019-04-09 DIAGNOSIS — Z86018 Personal history of other benign neoplasm: Secondary | ICD-10-CM | POA: Diagnosis not present

## 2019-04-09 DIAGNOSIS — D2371 Other benign neoplasm of skin of right lower limb, including hip: Secondary | ICD-10-CM | POA: Diagnosis not present

## 2019-04-09 DIAGNOSIS — D2239 Melanocytic nevi of other parts of face: Secondary | ICD-10-CM | POA: Diagnosis not present

## 2019-04-09 DIAGNOSIS — L910 Hypertrophic scar: Secondary | ICD-10-CM | POA: Diagnosis not present

## 2019-06-10 DIAGNOSIS — N952 Postmenopausal atrophic vaginitis: Secondary | ICD-10-CM | POA: Diagnosis not present

## 2019-06-10 DIAGNOSIS — E1169 Type 2 diabetes mellitus with other specified complication: Secondary | ICD-10-CM | POA: Diagnosis not present

## 2019-06-10 DIAGNOSIS — F322 Major depressive disorder, single episode, severe without psychotic features: Secondary | ICD-10-CM | POA: Diagnosis not present

## 2019-06-10 DIAGNOSIS — Z01419 Encounter for gynecological examination (general) (routine) without abnormal findings: Secondary | ICD-10-CM | POA: Diagnosis not present

## 2019-06-10 DIAGNOSIS — L900 Lichen sclerosus et atrophicus: Secondary | ICD-10-CM | POA: Diagnosis not present

## 2019-06-10 DIAGNOSIS — K219 Gastro-esophageal reflux disease without esophagitis: Secondary | ICD-10-CM | POA: Diagnosis not present

## 2019-06-10 DIAGNOSIS — Z1231 Encounter for screening mammogram for malignant neoplasm of breast: Secondary | ICD-10-CM | POA: Diagnosis not present

## 2019-06-10 DIAGNOSIS — J45909 Unspecified asthma, uncomplicated: Secondary | ICD-10-CM | POA: Diagnosis not present

## 2019-06-10 DIAGNOSIS — Z6833 Body mass index (BMI) 33.0-33.9, adult: Secondary | ICD-10-CM | POA: Diagnosis not present

## 2019-06-10 DIAGNOSIS — E7800 Pure hypercholesterolemia, unspecified: Secondary | ICD-10-CM | POA: Diagnosis not present

## 2019-06-10 DIAGNOSIS — E11319 Type 2 diabetes mellitus with unspecified diabetic retinopathy without macular edema: Secondary | ICD-10-CM | POA: Diagnosis not present

## 2019-06-10 DIAGNOSIS — F419 Anxiety disorder, unspecified: Secondary | ICD-10-CM | POA: Diagnosis not present

## 2019-06-10 MED FILL — ALBUTEROL SULFATE HFA 108 (: 108 (90 BAS | 48 days supply | Qty: 54 | Fill #0

## 2019-06-10 MED FILL — VALSARTAN 80 MG TABLET: 80 | 30 days supply | Qty: 30 | Fill #0

## 2019-06-10 MED FILL — BETAMETHASONE VALERATE 0.1: 0.1 | 21 days supply | Qty: 45 | Fill #0

## 2019-06-10 MED FILL — JARDIANCE 25 MG TABLET: 25 | 90 days supply | Qty: 90 | Fill #0

## 2019-06-10 MED FILL — ROSUVASTATIN CALCIUM 10 MG: 10 | 90 days supply | Qty: 45 | Fill #0

## 2019-09-15 DIAGNOSIS — K219 Gastro-esophageal reflux disease without esophagitis: Secondary | ICD-10-CM | POA: Diagnosis not present

## 2019-09-15 DIAGNOSIS — E1169 Type 2 diabetes mellitus with other specified complication: Secondary | ICD-10-CM | POA: Diagnosis not present

## 2019-09-15 DIAGNOSIS — E11319 Type 2 diabetes mellitus with unspecified diabetic retinopathy without macular edema: Secondary | ICD-10-CM | POA: Diagnosis not present

## 2019-09-15 DIAGNOSIS — E7800 Pure hypercholesterolemia, unspecified: Secondary | ICD-10-CM | POA: Diagnosis not present

## 2019-09-17 DIAGNOSIS — E7800 Pure hypercholesterolemia, unspecified: Secondary | ICD-10-CM | POA: Diagnosis not present

## 2019-09-17 DIAGNOSIS — R809 Proteinuria, unspecified: Secondary | ICD-10-CM | POA: Diagnosis not present

## 2019-09-17 DIAGNOSIS — E1169 Type 2 diabetes mellitus with other specified complication: Secondary | ICD-10-CM | POA: Diagnosis not present

## 2019-09-29 DIAGNOSIS — Z20828 Contact with and (suspected) exposure to other viral communicable diseases: Secondary | ICD-10-CM | POA: Diagnosis not present

## 2019-11-17 DIAGNOSIS — F339 Major depressive disorder, recurrent, unspecified: Secondary | ICD-10-CM | POA: Diagnosis not present

## 2019-11-17 DIAGNOSIS — F419 Anxiety disorder, unspecified: Secondary | ICD-10-CM | POA: Diagnosis not present

## 2020-02-24 DIAGNOSIS — L309 Dermatitis, unspecified: Secondary | ICD-10-CM | POA: Diagnosis not present

## 2020-02-24 DIAGNOSIS — L853 Xerosis cutis: Secondary | ICD-10-CM | POA: Diagnosis not present

## 2020-02-24 MED FILL — TRIAMCINOLONE ACETONIDE 0.1: 0.1 | 14 days supply | Qty: 100 | Fill #0

## 2020-03-03 DIAGNOSIS — F339 Major depressive disorder, recurrent, unspecified: Secondary | ICD-10-CM | POA: Diagnosis not present

## 2020-03-03 DIAGNOSIS — E7800 Pure hypercholesterolemia, unspecified: Secondary | ICD-10-CM | POA: Diagnosis not present

## 2020-03-03 DIAGNOSIS — E1169 Type 2 diabetes mellitus with other specified complication: Secondary | ICD-10-CM | POA: Diagnosis not present

## 2020-03-03 DIAGNOSIS — K219 Gastro-esophageal reflux disease without esophagitis: Secondary | ICD-10-CM | POA: Diagnosis not present

## 2020-03-03 DIAGNOSIS — Z0000 Encounter for general adult medical examination without abnormal findings: Secondary | ICD-10-CM | POA: Diagnosis not present

## 2020-04-11 DIAGNOSIS — L821 Other seborrheic keratosis: Secondary | ICD-10-CM | POA: Diagnosis not present

## 2020-04-11 DIAGNOSIS — D2371 Other benign neoplasm of skin of right lower limb, including hip: Secondary | ICD-10-CM | POA: Diagnosis not present

## 2020-04-11 DIAGNOSIS — D485 Neoplasm of uncertain behavior of skin: Secondary | ICD-10-CM | POA: Diagnosis not present

## 2020-04-11 DIAGNOSIS — D225 Melanocytic nevi of trunk: Secondary | ICD-10-CM | POA: Diagnosis not present

## 2020-04-11 DIAGNOSIS — D2271 Melanocytic nevi of right lower limb, including hip: Secondary | ICD-10-CM | POA: Diagnosis not present

## 2020-06-15 DIAGNOSIS — F339 Major depressive disorder, recurrent, unspecified: Secondary | ICD-10-CM | POA: Diagnosis not present

## 2020-06-15 DIAGNOSIS — F419 Anxiety disorder, unspecified: Secondary | ICD-10-CM | POA: Diagnosis not present

## 2020-06-15 DIAGNOSIS — E7800 Pure hypercholesterolemia, unspecified: Secondary | ICD-10-CM | POA: Diagnosis not present

## 2020-06-15 DIAGNOSIS — E1169 Type 2 diabetes mellitus with other specified complication: Secondary | ICD-10-CM | POA: Diagnosis not present

## 2020-06-27 DIAGNOSIS — Z1231 Encounter for screening mammogram for malignant neoplasm of breast: Secondary | ICD-10-CM | POA: Diagnosis not present

## 2020-06-27 DIAGNOSIS — Z01419 Encounter for gynecological examination (general) (routine) without abnormal findings: Secondary | ICD-10-CM | POA: Diagnosis not present

## 2020-06-27 DIAGNOSIS — Z6830 Body mass index (BMI) 30.0-30.9, adult: Secondary | ICD-10-CM | POA: Diagnosis not present

## 2020-06-27 DIAGNOSIS — Z13820 Encounter for screening for osteoporosis: Secondary | ICD-10-CM | POA: Diagnosis not present

## 2020-06-28 ENCOUNTER — Other Ambulatory Visit (HOSPITAL_COMMUNITY): Payer: Self-pay | Admitting: Obstetrics and Gynecology

## 2020-06-30 MED FILL — LIDOCAINE-PRILOCAINE CREAM: 2.5-2.5 | 30 days supply | Qty: 30 | Fill #0

## 2020-07-25 DIAGNOSIS — N762 Acute vulvitis: Secondary | ICD-10-CM | POA: Diagnosis not present

## 2020-07-25 DIAGNOSIS — L308 Other specified dermatitis: Secondary | ICD-10-CM | POA: Diagnosis not present

## 2020-10-16 DIAGNOSIS — E1169 Type 2 diabetes mellitus with other specified complication: Secondary | ICD-10-CM | POA: Diagnosis not present

## 2020-10-17 DIAGNOSIS — F339 Major depressive disorder, recurrent, unspecified: Secondary | ICD-10-CM | POA: Diagnosis not present

## 2020-10-17 DIAGNOSIS — E7800 Pure hypercholesterolemia, unspecified: Secondary | ICD-10-CM | POA: Diagnosis not present

## 2020-10-17 DIAGNOSIS — E113293 Type 2 diabetes mellitus with mild nonproliferative diabetic retinopathy without macular edema, bilateral: Secondary | ICD-10-CM | POA: Diagnosis not present

## 2020-10-17 DIAGNOSIS — E1169 Type 2 diabetes mellitus with other specified complication: Secondary | ICD-10-CM | POA: Diagnosis not present

## 2021-04-12 DIAGNOSIS — L578 Other skin changes due to chronic exposure to nonionizing radiation: Secondary | ICD-10-CM | POA: Diagnosis not present

## 2021-04-12 DIAGNOSIS — D2371 Other benign neoplasm of skin of right lower limb, including hip: Secondary | ICD-10-CM | POA: Diagnosis not present

## 2021-04-12 DIAGNOSIS — D2239 Melanocytic nevi of other parts of face: Secondary | ICD-10-CM | POA: Diagnosis not present

## 2021-04-12 DIAGNOSIS — D224 Melanocytic nevi of scalp and neck: Secondary | ICD-10-CM | POA: Diagnosis not present

## 2021-05-04 ENCOUNTER — Other Ambulatory Visit: Payer: BC Managed Care – PPO

## 2021-05-04 ENCOUNTER — Other Ambulatory Visit (HOSPITAL_COMMUNITY): Payer: Self-pay

## 2021-05-04 ENCOUNTER — Other Ambulatory Visit (INDEPENDENT_AMBULATORY_CARE_PROVIDER_SITE_OTHER): Payer: BC Managed Care – PPO

## 2021-05-04 ENCOUNTER — Encounter: Payer: Self-pay | Admitting: Gastroenterology

## 2021-05-04 ENCOUNTER — Ambulatory Visit (INDEPENDENT_AMBULATORY_CARE_PROVIDER_SITE_OTHER): Payer: BC Managed Care – PPO | Admitting: Gastroenterology

## 2021-05-04 VITALS — BP 124/70 | HR 76 | Ht 63.0 in | Wt 158.2 lb

## 2021-05-04 DIAGNOSIS — K581 Irritable bowel syndrome with constipation: Secondary | ICD-10-CM | POA: Diagnosis not present

## 2021-05-04 DIAGNOSIS — K580 Irritable bowel syndrome with diarrhea: Secondary | ICD-10-CM

## 2021-05-04 DIAGNOSIS — Z1211 Encounter for screening for malignant neoplasm of colon: Secondary | ICD-10-CM | POA: Diagnosis not present

## 2021-05-04 DIAGNOSIS — K219 Gastro-esophageal reflux disease without esophagitis: Secondary | ICD-10-CM | POA: Diagnosis not present

## 2021-05-04 LAB — CBC WITH DIFFERENTIAL/PLATELET
Basophils Absolute: 0.1 10*3/uL (ref 0.0–0.1)
Basophils Relative: 0.6 % (ref 0.0–3.0)
Eosinophils Absolute: 0.1 10*3/uL (ref 0.0–0.7)
Eosinophils Relative: 1 % (ref 0.0–5.0)
HCT: 39.8 % (ref 36.0–46.0)
Hemoglobin: 13.6 g/dL (ref 12.0–15.0)
Lymphocytes Relative: 13.5 % (ref 12.0–46.0)
Lymphs Abs: 1.5 10*3/uL (ref 0.7–4.0)
MCHC: 34.2 g/dL (ref 30.0–36.0)
MCV: 86.3 fl (ref 78.0–100.0)
Monocytes Absolute: 0.7 10*3/uL (ref 0.1–1.0)
Monocytes Relative: 6.7 % (ref 3.0–12.0)
Neutro Abs: 8.5 10*3/uL — ABNORMAL HIGH (ref 1.4–7.7)
Neutrophils Relative %: 78.2 % — ABNORMAL HIGH (ref 43.0–77.0)
Platelets: 306 10*3/uL (ref 150.0–400.0)
RBC: 4.62 Mil/uL (ref 3.87–5.11)
RDW: 13 % (ref 11.5–15.5)
WBC: 10.8 10*3/uL — ABNORMAL HIGH (ref 4.0–10.5)

## 2021-05-04 MED ORDER — PLENVU 140 G PO SOLR
140.0000 g | ORAL | 0 refills | Status: DC
  Filled 2021-05-04: qty 3, 1d supply, fill #0

## 2021-05-04 NOTE — Patient Instructions (Addendum)
If you are age 53 or older, your body mass index should be between 23-30. Your Body mass index is 28.02 kg/m. If this is out of the aforementioned range listed, please consider follow up with your Primary Care Provider.  If you are age 38 or younger, your body mass index should be between 19-25. Your Body mass index is 28.02 kg/m. If this is out of the aformentioned range listed, please consider follow up with your Primary Care Provider.   __________________________________________________________  [name] have been scheduled for a colonoscopy. Please follow written instructions given to you at your visit today.  Please pick up your prep supplies at the pharmacy within the next 1-3 days. If you use inhalers (even only as needed), please bring them with you on the day of your procedure.  Your provider has requested that you go to the basement level for lab work before leaving today. Press "B" on the elevator. The lab is located at the first door on the left as you exit the elevator.  The [facility] providers would like to encourage you to use [facility] to communicate with providers for non-urgent requests or questions.  Due to long hold times on the telephone, sending your provider a message by [facility] may be a faster and more efficient way to get a response.  Please allow 48 business hours for a response.  Please remember that this is for non-urgent requests.   Due to recent changes in healthcare laws, you may see the results of your imaging and laboratory studies on MyChart before your provider has had a chance to review them.  We understand that in some cases there may be results that are confusing or concerning to you. Not all laboratory results come back in the same time frame and the provider may be waiting for multiple results in order to interpret others.  Please give [place] 48 hours in order for your provider to thoroughly review all the results before contacting the office for clarification of your  results.    It was a pleasure to see you today!  Thank you for trusting me with your gastrointestinal care!

## 2021-05-04 NOTE — Progress Notes (Signed)
[facility] Consult Note:  History: [name] 05/04/2021  Referring provider: Michael Boston, MD  Reason for consult/chief complaint: Diarrhea (Patient states that her diarrhea is coming from taking Glutin out of her diet, and now has diarrhea after having glutin, along with dairy and fried foods) and Colonoscopy (Due for Colonoscopy, PCP is wanting her to be tested for UC and Crohn's)   Subjective  HPI: From my 04/11/2016 office consult note; "[name] follows up for the first time in almost 2 years. She previously saw Dr. Deatra Ina and our [name] for GERD and dysphagia. Upper endoscopy in 2010 revealed a small hiatal hernia, esophageal stricture which was dilated, and no erosive esophagitis. She was maintained on Zegerid until they no longer made it, now she takes omeprazole 40 mg once daily along with sodium bicarbonate 650 mg. If she takes her medicines, she feels well, with infrequent episodes of pyrosis. However, she still has nocturnal episodes of regurgitation at times feelings of aspiration. [name] typically takes her medicine in the morning. She denies dysphagia. For the last couple of years she also tends to the need for a bowel movement about 30-40 minutes after meal, but the stool is formed and without blood. She is recovering from a hysterectomy done 5 weeks ago."  I asked if she had considered a fundoplication, she did not feel ready to do so and preferred to continue acid suppression medicine.  Dr. Kelby Fam 05/04/2012 office note indicates patient was describing some intermittent sharp lower abdominal pain with diarrhea as well. __________________  [name] was sent back to see [place] by her new PCP for colon cancer screening and chronic diarrhea.  She describes symptoms similar to before with some intermittent crampy lower abdominal pain with urgency for bowel movements that might be loose.  Symptoms seem to worsen in the last few years, though her reflux  symptoms have improved considerably with about a 40 pound purposeful weight loss.  She now only needs PPI every 2 to 3 days and denies dysphagia or vomiting. Her daughter with special needs was found to have celiac, so [name] and her husband went on a largely gluten-free and lactose-free diet and she has had improvement in the GI symptoms.  Sometimes her stools are black but not tarry. She has not had formal celiac testing.   ROS:  Review of Systems  Constitutional:  Negative for appetite change and unexpected weight change.  HENT:  Negative for mouth sores and voice change.   Eyes:  Negative for pain and redness.  Respiratory:  Negative for cough and shortness of breath.   Cardiovascular:  Negative for chest pain and palpitations.  Genitourinary:  Negative for dysuria and hematuria.  Musculoskeletal:  Negative for arthralgias and myalgias.  Skin:  Negative for pallor and rash.  Neurological:  Negative for weakness and headaches.  Hematological:  Negative for adenopathy.    Past Medical History: Past Medical History:  Diagnosis Date   Anemia    Asthma    Basal cell carcinoma    Complication of anesthesia    hard time waking up afterwards   Diabetes mellitus without complication (HCC)    diet controlled   GERD (gastroesophageal reflux disease)    Hyperlipidemia      Past Surgical History: Past Surgical History:  Procedure Laterality Date   CHOLECYSTECTOMY     LAPAROSCOPIC VAGINAL HYSTERECTOMY WITH SALPINGO OOPHORECTOMY Bilateral 03/05/2016   Procedure: LAPAROSCOPIC ASSISTED VAGINAL HYSTERECTOMY WITH BILATERAL SALPINGO OOPHORECTOMY;  Surgeon: Everlene Farrier,  MD;  Location: [facility];  Service: Gynecology;  Laterality: Bilateral;   ROTATOR CUFF REPAIR     right     Family History: Family History  Problem Relation Age of Onset   Diabetes Unknown        mat. side, diet controlled   Lung cancer Mother        non-smoker, ? original site   Colon cancer Neg Hx     Social  History: Social History   Socioeconomic History   Marital status: Married    Spouse name: Not on file   Number of children: 2   Years of education: Not on file   Highest education level: Not on file  Occupational History   Occupation: Patent attorney: Agricultural engineer  Tobacco Use   Smoking status: Never   Smokeless tobacco: Never  Vaping Use   Vaping Use: Never used  Substance and Sexual Activity   Alcohol use: Yes    Comment: rare   Drug use: No   Sexual activity: Yes    Birth control/protection: Surgical    Comment: husband vasectomy  Other Topics Concern   Not on file  Social History Narrative   Not on file   Social Determinants of Health   Financial Resource Strain: Not on file  Food Insecurity: Not on file  Transportation Needs: Not on file  Physical Activity: Not on file  Stress: Not on file  Social Connections: Not on file   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       ALPRAZolam (XANAX) 0.25 MG tablet Take 0.25 mg by mouth at bedtime as needed for anxiety.     atorvastatin (LIPITOR) 20 MG tablet Take 20 mg by mouth daily.     fexofenadine (ALLEGRA) 180 MG tablet Take 180 mg by mouth daily.       fluticasone-salmeterol (ADVAIR HFA) 115-21 MCG/ACT inhaler Inhale 2 puffs into the lungs 2 (two) times daily.       lisinopril (PRINIVIL,ZESTRIL) 10 MG tablet Take 10 mg by mouth daily.     omeprazole (PRILOSEC) 40 MG capsule Take 1 capsule (40 mg total) by mouth daily. 30 capsule 6   PEG-KCl-NaCl-NaSulf-Na Asc-C (PLENVU) 140 g SOLR Take 140 g by mouth as directed. 3 each 0    rosuvastatin (CRESTOR) 20 MG tablet Take 10-20 mg by mouth daily.     RYBELSUS 14 MG TABS Take 1 tablet by mouth daily before breakfast.     valsartan (DIOVAN) 80 MG tablet Take 80 mg by mouth daily.     No current facility-administered medications for this visit.      ___________________________________________________________________ Objective   Exam:  BP 124/70   Pulse 76   Ht 5\' 3"  (1.6 m)   Wt 158 lb 3.2 oz (71.8 kg)   LMP 02/26/2016 (Exact Date)   BMI 28.02 kg/m  Wt Readings from Last 3 Encounters:  05/04/21 158 lb 3.2 oz (71.8 kg)  04/11/16 187 lb 2 oz (84.9 kg)  02/22/16 194 lb 4 oz (88.1 kg)    General: Well-appearing, normal vocal quality Eyes:  sclera anicteric, no redness ENT: oral mucosa moist without lesions, no cervical or supraclavicular lymphadenopathy CV: RRR without murmur, S1/S2, no JVD, no peripheral edema Resp: clear to auscultation bilaterally, normal RR and effort noted GI: soft, mild LLQ tenderness, with active bowel sounds. No guarding or palpable organomegaly noted. Skin; warm and dry, no rash or jaundice noted Neuro: awake, alert and oriented x 3. Normal gross motor function and fluent speech  Labs:  No lab results to review, though at most recent PCP visit on 03/14/2021, she appears to have had CMP, TSH, hemoglobin A1c but no CBC done. Shahida recalls being told her levels were normal and hemoglobin A1c down to 5.7  Assessment: Encounter Diagnoses  Name Primary?   Special screening for malignant neoplasms, colon Yes   Gastroesophageal reflux disease without esophagitis    Irritable bowel syndrome with diarrhea    Probable IBS with diarrhea and food triggers.  Unknown if true celiac versus nonceliac gluten sensitive.  On a mostly gluten-free diet now, so antibody testing might be unreliable.  She is committed to staying on that diet anyway.  GERD symptoms much improved in the last couple of years when she purposely lost weight with this diet  change.  Average risk for colorectal cancer.  Plan: CBC because describes stools as black sometimes, rule out anemia  Screening colonoscopy.  (Take biopsies at that time to rule out microscopic colitis.) She was agreeable after discussion of procedure and risks.  The benefits and risks of the planned procedure were described in detail with the patient or (when appropriate) their health care proxy.  Risks were outlined as including, but not limited to, bleeding, infection, perforation, adverse medication reaction leading to cardiac or pulmonary decompensation, pancreatitis (if ERCP).  The limitation of incomplete mucosal visualization was also discussed.  No guarantees or warranties were given.   Thank you for the courtesy of this consult.  Please call me with any questions or concerns.  Nelida Meuse III  CC: Referring provider noted above

## 2021-05-10 ENCOUNTER — Encounter: Payer: Self-pay | Admitting: Gastroenterology

## 2021-05-17 ENCOUNTER — Other Ambulatory Visit: Payer: Self-pay

## 2021-05-17 ENCOUNTER — Encounter: Payer: Self-pay | Admitting: Gastroenterology

## 2021-05-17 ENCOUNTER — Ambulatory Visit (AMBULATORY_SURGERY_CENTER): Payer: BC Managed Care – PPO | Admitting: Gastroenterology

## 2021-05-17 VITALS — BP 107/62 | HR 78 | Temp 97.7°F | Resp 16 | Ht 63.0 in | Wt 158.0 lb

## 2021-05-17 DIAGNOSIS — Z1211 Encounter for screening for malignant neoplasm of colon: Secondary | ICD-10-CM

## 2021-05-17 DIAGNOSIS — K635 Polyp of colon: Secondary | ICD-10-CM | POA: Diagnosis not present

## 2021-05-17 DIAGNOSIS — R197 Diarrhea, unspecified: Secondary | ICD-10-CM | POA: Diagnosis not present

## 2021-05-17 DIAGNOSIS — D122 Benign neoplasm of ascending colon: Secondary | ICD-10-CM

## 2021-05-17 MED ORDER — SODIUM CHLORIDE 0.9 % IV SOLN: 500.0000 mL | Freq: Once | INTRAVENOUS | Status: DC

## 2021-05-17 NOTE — Progress Notes (Signed)
No changes to clinical history since GI office visit on 05/04/21.  The patient is appropriate for an endoscopic procedure in the ambulatory setting.

## 2021-05-17 NOTE — Progress Notes (Signed)
Sedate, gd SR, tolerated procedure well, VSS, report to RN 

## 2021-05-17 NOTE — Patient Instructions (Signed)
Handout given:  polyps Resume previous diet Continue current medications Await pathology results  YOU HAD AN ENDOSCOPIC PROCEDURE TODAY AT [facility]:   Refer to the procedure report that was given to you for any specific questions about what was found during the examination.  If the procedure report does not answer your questions, please call your gastroenterologist to clarify.  If you requested that your care partner not be given the details of your procedure findings, then the procedure report has been included in a sealed envelope for you to review at your convenience later.  YOU SHOULD EXPECT: Some feelings of bloating in the abdomen. Passage of more gas than usual.  Walking can help get rid of the air that was put into your GI tract during the procedure and reduce the bloating. If you had a lower endoscopy (such as a colonoscopy or flexible sigmoidoscopy) you may notice spotting of blood in your stool or on the toilet paper. If you underwent a bowel prep for your procedure, you may not have a normal bowel movement for a few days.  Please Note:  You might notice some irritation and congestion in your nose or some drainage.  This is from the oxygen used during your procedure.  There is no need for concern and it should clear up in a day or so.  SYMPTOMS TO REPORT IMMEDIATELY:  Following lower endoscopy (colonoscopy or flexible sigmoidoscopy):  Excessive amounts of blood in the stool  Significant tenderness or worsening of abdominal pains  Swelling of the abdomen that is new, acute  Fever of 100F or higher  For urgent or emergent issues, a gastroenterologist can be reached at any hour by calling [phone]. Do not use MyChart messaging for urgent concerns.    DIET:  We do recommed a small meal at first, but then you may proceed to your regular diet.  Drink plenty of fluids but you should avoid alcoholic beverages for 24 hours.  ACTIVITY:  You should plan to take it  easy for the rest of today and you should NOT DRIVE or use heavy machinery until tomorrow (because of the sedation medicines used during the test).    FOLLOW UP: Our staff will call the number listed on your records 48-72 hours following your procedure to check on you and address any questions or concerns that you may have regarding the information given to you following your procedure. If we do not reach you, we will leave a message.  We will attempt to reach you two times.  During this call, we will ask if you have developed any symptoms of COVID 19. If you develop any symptoms (ie: fever, flu-like symptoms, shortness of breath, cough etc.) before then, please call [phone].  If you test positive for Covid 19 in the 2 weeks post procedure, please call and report this information to [place].    If any biopsies were taken you will be contacted by phone or by letter within the next 1-3 weeks.  Please call us at [phone] if you have not heard about the biopsies in 3 weeks.   SIGNATURES/CONFIDENTIALITY: You and/or your care partner have signed paperwork which will be entered into your electronic medical record.  These signatures attest to the fact that that the information above on your After Visit Summary has been reviewed and is understood.  Full responsibility of the confidentiality of this discharge information lies with you and/or your care-partner.

## 2021-05-17 NOTE — Progress Notes (Signed)
Pt's states no medical or surgical changes since previsit or office visit. VS assessed by D.T 

## 2021-05-17 NOTE — Op Note (Signed)
[facility] Patient Name: [name] Procedure Date: 05/17/2021 11:11 AM MRN: [id number] Endoscopist: Mallie Mussel L. Loletha Carrow , MD Age: 55 Referring MD:  Date of Birth: [birth year] Gender: Female Account #: [id number] Procedure:                Colonoscopy Indications:              Screening for colorectal malignant neoplasm, This                            is the patient's first colonoscopy, Incidental                            diarrhea noted Medicines:                Monitored Anesthesia Care Procedure:                Pre-Anesthesia Assessment:                           - Prior to the procedure, a History and Physical                            was performed, and patient medications and                            allergies were reviewed. The patient's tolerance of                            previous anesthesia was also reviewed. The risks                            and benefits of the procedure and the sedation                            options and risks were discussed with the patient.                            All questions were answered, and informed consent                            was obtained. Prior Anticoagulants: The patient has                            taken no previous anticoagulant or antiplatelet                            agents. ASA Grade Assessment: II - A patient with                            mild systemic disease. After reviewing the risks                            and benefits, the patient was deemed in  satisfactory condition to undergo the procedure.                           After obtaining informed consent, the colonoscope                            was passed under direct vision. Throughout the                            procedure, the patient's blood pressure, pulse, and                            oxygen saturations were monitored continuously. The                            Olympus CF-HQ190L (Serial# [id number]) Colonoscope  was                            introduced through the anus and advanced to the the                            cecum, identified by appendiceal orifice and                            ileocecal valve. The colonoscopy was performed                            without difficulty. The patient tolerated the                            procedure well. The quality of the bowel                            preparation was excellent with lavage. The                            ileocecal valve, appendiceal orifice, and rectum                            were photographed. The bowel preparation used was                            Plenvu. Scope In: 11:17:37 AM Scope Out: 11:36:36 AM Scope Withdrawal Time: 0 hours 14 minutes 1 second  Total Procedure Duration: 0 hours 18 minutes 59 seconds  Findings:                 The perianal and digital rectal examinations were                            normal.                           A diminutive polyp was found in the ascending  colon. The polyp was semi-sessile. The polyp was                            removed with a cold snare. Resection and retrieval                            were complete.                           Normal mucosa was found in the entire colon.                            Biopsies for histology were taken with a cold                            forceps from the right colon and left colon for                            evaluation of microscopic colitis.                           The exam was otherwise without abnormality on                            direct and retroflexion views. Complications:            No immediate complications. Estimated Blood Loss:     Estimated blood loss was minimal. Impression:               - One diminutive polyp in the ascending colon,                            removed with a cold snare. Resected and retrieved.                           - Normal mucosa in the entire examined colon.                             Biopsied.                           - The examination was otherwise normal on direct                            and retroflexion views. Recommendation:           - Patient has a contact number available for                            emergencies. The signs and symptoms of potential                            delayed complications were discussed with the                            patient. Return to  normal activities tomorrow.                            Written discharge instructions were provided to the                            patient.                           - Resume previous diet.                           - Continue present medications.                           - Await pathology results.                           - Repeat colonoscopy is recommended for                            surveillance. The colonoscopy date will be                            determined after pathology results from today's                            exam become available for review. Molly Maselli L. Loletha Carrow, MD 05/17/2021 11:40:49 AM This report has been signed electronically.

## 2021-05-17 NOTE — Progress Notes (Signed)
Called to room to assist during endoscopic procedure.  Patient ID and intended procedure confirmed with present staff. Received instructions for my participation in the procedure from the performing physician.  

## 2021-05-21 ENCOUNTER — Telehealth: Payer: Self-pay

## 2021-05-21 ENCOUNTER — Telehealth: Payer: Self-pay | Admitting: *Deleted

## 2021-05-21 NOTE — Telephone Encounter (Signed)
Left message on follow up call. 

## 2021-05-21 NOTE — Telephone Encounter (Signed)
  Follow up Call-  Call back number 05/17/2021  Post procedure Call Back phone  # [phone]  Permission to leave phone message Yes  Some recent data might be hidden     Patient questions:  Do you have a fever, pain , or abdominal swelling? No. Pain Score  0 *  Have you tolerated food without any problems? Yes.    Have you been able to return to your normal activities? Yes.    Do you have any questions about your discharge instructions: Diet   No. Medications  No. Follow up visit  No.  Do you have questions or concerns about your Care? Yes.    Actions: * If pain score is 4 or above: No action needed, pain <4.  Have you developed a fever since your procedure? no  2.   Have you had an respiratory symptoms (SOB or cough) since your procedure? no  3.   Have you tested positive for COVID 19 since your procedure no  4.   Have you had any family members/close contacts diagnosed with the COVID 19 since your procedure?  no   If yes to any of these questions please route to Joylene John, RN and Joella Prince, RN

## 2021-05-23 ENCOUNTER — Encounter: Payer: Self-pay | Admitting: Gastroenterology

## 2021-05-23 ENCOUNTER — Other Ambulatory Visit: Payer: Self-pay

## 2021-05-23 ENCOUNTER — Telehealth: Payer: Self-pay | Admitting: Gastroenterology

## 2021-05-23 MED ORDER — HYOSCYAMINE SULFATE 0.125 MG SL SUBL: 0.1250 mg | SUBLINGUAL_TABLET | Freq: Three times a day (TID) | SUBLINGUAL | 1 refills | Status: DC | PRN

## 2021-05-23 NOTE — Telephone Encounter (Signed)
Inbound call from pt stating that Levsin isn't covered under her insurance. Pt is wanted to know if there is an alternative. Please advise. Thank you.

## 2021-05-23 NOTE — Telephone Encounter (Signed)
Please advise on the Levsin

## 2021-05-28 NOTE — Telephone Encounter (Signed)
Can try dicyclomine 20 mg capsule Sig: one capsule up to three times daily as needed for cramps/diarrhea Disp#60, RF 1 If she does that, please cancel the hyoscyamine Rx  However, the levsin tends to work better and cause less dry mouth, so if the cash pay price is reasonable, she may still want to try it.  - HD

## 2021-05-28 NOTE — Telephone Encounter (Signed)
I spoke to patient and she has picked up the medication

## 2021-07-09 NOTE — Telephone Encounter (Signed)
Patient called to advise the Hyoscyamine medication is working well for her.

## 2021-07-11 ENCOUNTER — Ambulatory Visit: Payer: BC Managed Care – PPO | Admitting: Gastroenterology

## 2021-10-22 ENCOUNTER — Other Ambulatory Visit: Payer: Self-pay | Admitting: Gastroenterology

## 2021-10-22 ENCOUNTER — Other Ambulatory Visit: Payer: Self-pay

## 2021-10-22 ENCOUNTER — Other Ambulatory Visit (HOSPITAL_COMMUNITY): Payer: Self-pay

## 2021-10-22 MED ORDER — HYOSCYAMINE SULFATE 0.125 MG SL SUBL
0.1250 mg | SUBLINGUAL_TABLET | Freq: Three times a day (TID) | SUBLINGUAL | 1 refills | Status: DC | PRN
  Filled 2021-10-22: qty 30, 10d supply, fill #0

## 2022-04-17 ENCOUNTER — Other Ambulatory Visit (HOSPITAL_COMMUNITY): Payer: Self-pay | Admitting: Internal Medicine

## 2022-04-17 DIAGNOSIS — R011 Cardiac murmur, unspecified: Secondary | ICD-10-CM

## 2022-05-09 ENCOUNTER — Other Ambulatory Visit (HOSPITAL_COMMUNITY): Payer: BC Managed Care – PPO

## 2022-05-14 ENCOUNTER — Ambulatory Visit (HOSPITAL_COMMUNITY): Payer: PRIVATE HEALTH INSURANCE | Attending: Cardiology

## 2022-05-14 DIAGNOSIS — R011 Cardiac murmur, unspecified: Secondary | ICD-10-CM | POA: Diagnosis present

## 2022-05-14 LAB — ECHOCARDIOGRAM COMPLETE
Area-P 1/2: 4.31 cm2
S' Lateral: 2.5 cm

## 2023-05-19 ENCOUNTER — Other Ambulatory Visit: Payer: Self-pay | Admitting: Gastroenterology

## 2023-05-22 NOTE — Telephone Encounter (Signed)
Received refill request for hyoscyamine on patient last seen over 2 years ago.  Please contact her Regarding the following: This medicine can be refilled as prescribed, but she needs an appointment to see me to check up on her condition in order to receive future refills.  She can see me in person if she likes, or I have a telemedicine clinic open on December 19.  Alternatively, if she checks with her primary care provider and they are agreeable to prescribing this medicine for her after this current refill, then that is fine with me and we would not need to trouble her with the visit.  Thank you  H Danis

## 2023-05-23 NOTE — Telephone Encounter (Signed)
Spoke with patient regarding refill & scheduling OV. She will reach out to her PCP & if they are unable to fill it she will give [place] a call back to schedule an appointment and ask for further refills.

## 2023-08-19 DIAGNOSIS — Z1272 Encounter for screening for malignant neoplasm of vagina: Secondary | ICD-10-CM | POA: Diagnosis not present

## 2023-08-19 DIAGNOSIS — R6882 Decreased libido: Secondary | ICD-10-CM | POA: Diagnosis not present

## 2023-08-19 DIAGNOSIS — N952 Postmenopausal atrophic vaginitis: Secondary | ICD-10-CM | POA: Diagnosis not present

## 2023-08-19 DIAGNOSIS — Z01419 Encounter for gynecological examination (general) (routine) without abnormal findings: Secondary | ICD-10-CM | POA: Diagnosis not present

## 2023-08-19 DIAGNOSIS — Z6829 Body mass index (BMI) 29.0-29.9, adult: Secondary | ICD-10-CM | POA: Diagnosis not present

## 2023-08-19 DIAGNOSIS — Z1231 Encounter for screening mammogram for malignant neoplasm of breast: Secondary | ICD-10-CM | POA: Diagnosis not present

## 2023-10-14 DIAGNOSIS — E1169 Type 2 diabetes mellitus with other specified complication: Secondary | ICD-10-CM | POA: Diagnosis not present

## 2023-10-14 DIAGNOSIS — E785 Hyperlipidemia, unspecified: Secondary | ICD-10-CM | POA: Diagnosis not present

## 2023-10-21 DIAGNOSIS — E785 Hyperlipidemia, unspecified: Secondary | ICD-10-CM | POA: Diagnosis not present

## 2023-10-21 DIAGNOSIS — Z1339 Encounter for screening examination for other mental health and behavioral disorders: Secondary | ICD-10-CM | POA: Diagnosis not present

## 2023-10-21 DIAGNOSIS — Z6829 Body mass index (BMI) 29.0-29.9, adult: Secondary | ICD-10-CM | POA: Diagnosis not present

## 2023-10-21 DIAGNOSIS — J309 Allergic rhinitis, unspecified: Secondary | ICD-10-CM | POA: Diagnosis not present

## 2023-10-21 DIAGNOSIS — E663 Overweight: Secondary | ICD-10-CM | POA: Diagnosis not present

## 2023-10-21 DIAGNOSIS — J4520 Mild intermittent asthma, uncomplicated: Secondary | ICD-10-CM | POA: Diagnosis not present

## 2023-10-21 DIAGNOSIS — Z1331 Encounter for screening for depression: Secondary | ICD-10-CM | POA: Diagnosis not present

## 2023-10-21 DIAGNOSIS — K219 Gastro-esophageal reflux disease without esophagitis: Secondary | ICD-10-CM | POA: Diagnosis not present

## 2023-10-21 DIAGNOSIS — Z0000 Encounter for general adult medical examination without abnormal findings: Secondary | ICD-10-CM | POA: Diagnosis not present

## 2023-10-21 DIAGNOSIS — E1169 Type 2 diabetes mellitus with other specified complication: Secondary | ICD-10-CM | POA: Diagnosis not present

## 2023-11-29 ENCOUNTER — Other Ambulatory Visit (HOSPITAL_BASED_OUTPATIENT_CLINIC_OR_DEPARTMENT_OTHER): Payer: Self-pay

## 2023-12-16 ENCOUNTER — Other Ambulatory Visit (HOSPITAL_BASED_OUTPATIENT_CLINIC_OR_DEPARTMENT_OTHER): Payer: Self-pay

## 2024-04-21 DIAGNOSIS — E785 Hyperlipidemia, unspecified: Secondary | ICD-10-CM | POA: Diagnosis not present

## 2024-06-07 ENCOUNTER — Other Ambulatory Visit (HOSPITAL_COMMUNITY): Payer: Self-pay

## 2024-06-07 ENCOUNTER — Encounter (HOSPITAL_BASED_OUTPATIENT_CLINIC_OR_DEPARTMENT_OTHER): Payer: Self-pay | Admitting: Internal Medicine

## 2024-06-07 ENCOUNTER — Other Ambulatory Visit (HOSPITAL_BASED_OUTPATIENT_CLINIC_OR_DEPARTMENT_OTHER): Payer: Self-pay

## 2024-06-07 ENCOUNTER — Telehealth: Payer: Self-pay | Admitting: Pharmacy Technician

## 2024-06-07 ENCOUNTER — Ambulatory Visit (HOSPITAL_BASED_OUTPATIENT_CLINIC_OR_DEPARTMENT_OTHER): Admitting: Internal Medicine

## 2024-06-07 VITALS — BP 126/84 | HR 100 | Ht 63.0 in | Wt 167.0 lb

## 2024-06-07 DIAGNOSIS — E7841 Elevated Lipoprotein(a): Secondary | ICD-10-CM | POA: Diagnosis not present

## 2024-06-07 DIAGNOSIS — E785 Hyperlipidemia, unspecified: Secondary | ICD-10-CM

## 2024-06-07 DIAGNOSIS — H0260 Xanthelasma of unspecified eye, unspecified eyelid: Secondary | ICD-10-CM | POA: Diagnosis not present

## 2024-06-07 MED ORDER — REPATHA SURECLICK 140 MG/ML ~~LOC~~ SOAJ
140.0000 mg | SUBCUTANEOUS | 3 refills | Status: AC
  Filled 2024-06-07 (×2): qty 6, 84d supply, fill #0

## 2024-06-07 NOTE — Patient Instructions (Signed)
 Medication Instructions:  Dr. Mona recommends Repatha (PCSK9). This is an injectable cholesterol medication self-administered once every 14 days. This medication will likely need prior approval with your insurance company, which we will work on. If the medication is not approved initially, we may need to do an appeal with your insurance. If approved, we will provide you with copay and cost information. We'll then send the prescription to your pharmacy. We would have you complete another set of fasting labs between 3-4 months to reassess cholesterol.   Repatha is self-injected once every 14 days in subcutaneous or fatty tissue - such as belly or side/outer/upper thigh. It is best stored in the refrigerator but is stable at room temp up to 28 days. Please take the pen-injector out of fridge about 30 minutes - 1 hour prior to injection, to allow it to warm closer to room temperature.   This medication is very effective in lowering LDL and can lower LPa, as Dr. Mona mentioned. It is also generally well tolerated -- most common reaction may be cold-like symptoms such as runny nose, scratchy throat, as this is an antibody therapy. It is generally self-limiting and after a few doses, your body should have normalized to the medication.   Here is a demo video: https://www.schwartz.org/   If you need a co-pay card for Repatha: lawsponsor.fr  Patient Assistance:   These foundations have funds at various times.   The [employer]: https://www.panfoundation.org/disease-funds/hypercholesterolemia/ -- can sign up for wait list  The [facility] offers assistance to help pay for medication copays.  They will cover copays for all cholesterol lowering meds, including statins, fibrates, omega-3 fish oils like Vascepa, ezetimibe, Repatha, Praluent, Nexletol, Nexlizet.  The cards are usually good for $2,500 or 12 months, whichever comes first. Our fax #  is [phone] (you will need this to apply) Go to healthwellfoundation.org Click on "Apply Now" Answer questions as to whom is applying (patient or representative) Your disease fund will be "hypercholesterolemia - Medicare access" They will ask questions about finances and which medications you are taking for cholesterol When you submit, the approval is usually within minutes.  You will need to print the card information from the site You will need to show this information to your pharmacy, they will bill your Medicare Part D plan first -then bill [employer] --for the copay.   You can also call them at [phone], although the hold times can be quite long.      *If you need a refill on your cardiac medications before your next appointment, please call your pharmacy*  Lab Work: Return in 3 months for blood work (NMR lipoprofile, Lpa)   Testing/Procedures: none  Follow-Up: As needed

## 2024-06-07 NOTE — Progress Notes (Addendum)
 LIPID CLINIC CONSULT NOTE  Chief Complaint:  Manage dyslipidemia  Primary Care Physician: Stephane Leita DEL, MD  Primary Cardiologist:  None  HPI:  [name] is a 58 y.o. female who is being seen today for the evaluation of dyslipidemia at the request of Wile, Leita DEL, MD. This is a pleasant 58 year old female kindly referred for evaluation management of dyslipidemia.  She has a history of high cholesterol in the past although she had a coronary calcium score of 0 in April 2025.  Despite this her recent labs show total cholesterol 178, LDL 110, triglycerides 99 HDL 48.  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  Of note her father had an MI at age 40 so there is family history of heart disease.  Also it has been noted before and exam that she has bilateral lid xanthelasma.  PMHx:  Past Medical History:  Diagnosis Date   Anemia    Asthma    Basal cell carcinoma    Complication of anesthesia    hard time waking up afterwards   Diabetes mellitus without complication (HCC)    diet controlled   GERD (gastroesophageal reflux disease)    Hyperlipidemia     Past Surgical History:  Procedure Laterality Date   CHOLECYSTECTOMY     LAPAROSCOPIC VAGINAL HYSTERECTOMY WITH SALPINGO OOPHORECTOMY Bilateral 03/05/2016   Procedure: LAPAROSCOPIC ASSISTED VAGINAL HYSTERECTOMY WITH BILATERAL SALPINGO OOPHORECTOMY;  Surgeon: Lynwood Clubs, MD;  Location: [facility];  Service: Gynecology;  Laterality: Bilateral;   ROTATOR CUFF REPAIR     right    FAMHx:  Family History  Problem Relation Age of Onset   Diabetes Unknown        mat. side, diet controlled   Lung cancer Mother        non-smoker, ? original site   Colon cancer Neg Hx     SOCHx:   reports that she has never smoked. She has never used smokeless tobacco. She reports current alcohol  use. She reports that she does not use drugs.  ALLERGIES:  Allergies  Allergen Reactions   Codeine Other (See Comments)     Patient feels wacked up   Penicillins Hives and Swelling    Massive swelling    ROS: Pertinent items noted in HPI and remainder of comprehensive ROS otherwise negative.  HOME MEDS: Current Outpatient Medications on File Prior to Visit  Medication Sig Dispense Refill   albuterol  (PROVENTIL  HFA;VENTOLIN  HFA) 108 (90 BASE) MCG/ACT inhaler Inhale 2 puffs into the lungs every 6 (six) hours as needed.       Azelastine HCl 137 MCG/SPRAY SOLN 2 puffs (1 spray in each nostril) Nasally Twice a day; Duration: 30 days As needed     diazepam (VALIUM) 5 MG tablet 1 TABLET AS NEEDED ORALLY ONCE A DAY AS NEEDED     fexofenadine (ALLEGRA) 180 MG tablet Take 180 mg by mouth daily.       fexofenadine-pseudoephedrine (ALLEGRA-D) 60-120 MG 12 hr tablet 1 tablet Orally Twice a day As needed     fluticasone-salmeterol (ADVAIR HFA) 115-21 MCG/ACT inhaler Inhale 2 puffs into the lungs 2 (two) times daily.       omeprazole  (PRILOSEC) 40 MG capsule Take 1 capsule (40 mg total) by mouth daily. 30 capsule 6   rosuvastatin (CRESTOR) 20 MG tablet Take 10-20 mg by mouth daily.     atorvastatin (LIPITOR) 20 MG tablet Take 20 mg by mouth daily. (Patient not taking: Reported on  06/07/2024)     hyoscyamine  (LEVSIN  SL) 0.125 MG SL tablet PLACE 1 TABLET UNDER THE TONGUE 3 (THREE) TIMES DAILY AS NEEDED FOR CRAMPING (DIARRHEA). (Patient not taking: Reported on 06/07/2024) 30 tablet 1   lisinopril (PRINIVIL,ZESTRIL) 10 MG tablet Take 10 mg by mouth daily. (Patient not taking: Reported on 06/07/2024)     RYBELSUS 14 MG TABS Take 1 tablet by mouth daily before breakfast. (Patient not taking: Reported on 06/07/2024)     valsartan (DIOVAN) 80 MG tablet Take 80 mg by mouth daily. (Patient not taking: Reported on 06/07/2024)     No current facility-administered medications on file prior to visit.    LABS/IMAGING: No results found for this or any previous visit (from the past 48 hours). No results found.  LIPID PANEL: No  results found for: CHOL, TRIG, HDL, CHOLHDL, VLDL, LDLCALC, LDLDIRECT  No results found for: LIPOA   WEIGHTS: Wt Readings from Last 3 Encounters:  06/07/24 167 lb (75.8 kg)  05/17/21 158 lb (71.7 kg)  05/04/21 158 lb 3.2 oz (71.8 kg)    VITALS: BP 126/84   Pulse 100   Ht 5' 3 (1.6 m)   Wt 167 lb (75.8 kg)   LMP 02/26/2016 (Exact Date)   SpO2 97%   BMI 29.58 kg/m   EXAM: Neck: Bilateral eyelid xanthelasma  EKG: Deferred  ASSESSMENT: Dyslipidemia, goal LDL less than 70 Elevated OE(j)-874 nmol/L 0 CAC score (11/2023) Xanthelasma  PLAN: 1.   Ms. Renard has a dyslipidemia with a target LDL less than 70 due to elevated LP(a) although she has no coronary calcium I would suggest lipid-lowering.  She is already on rosuvastatin 20 mg daily but her LDL remains at 110.  She is a good candidate for additional therapy and I would recommend adding Repatha  to help reach a goal LDL less than 70.  This may also help lower LP(a).  She is in agreement with this plan.  Follow-up lipids with an NMR and LP(a) in 3 to 4 months.  Thanks again for the kind referral.  Vinie KYM Maxcy, MD, [facility], FNLA, FACP  [place]  [facility]  Medical Director of the [facility] Lipid [facility] &  Cardiovascular Risk Reduction Clinic Diplomate of the American Board of Clinical Lipidology Attending Cardiologist  Direct Dial: [phone]  Fax: [phone]  Website:  [email or web address] Vinie BROCKS Soloman Mckeithan 06/07/2024, 10:32 AM

## 2024-06-07 NOTE — Telephone Encounter (Signed)
 Submit paper PA- waiting on chart notes

## 2024-06-09 ENCOUNTER — Other Ambulatory Visit (HOSPITAL_COMMUNITY): Payer: Self-pay

## 2024-06-09 NOTE — Telephone Encounter (Signed)
 Submitted PA

## 2024-06-11 ENCOUNTER — Encounter (HOSPITAL_BASED_OUTPATIENT_CLINIC_OR_DEPARTMENT_OTHER): Payer: Self-pay | Admitting: Internal Medicine

## 2024-06-14 ENCOUNTER — Other Ambulatory Visit (HOSPITAL_COMMUNITY): Payer: Self-pay

## 2024-06-14 ENCOUNTER — Other Ambulatory Visit (HOSPITAL_BASED_OUTPATIENT_CLINIC_OR_DEPARTMENT_OTHER): Payer: Self-pay

## 2024-06-15 ENCOUNTER — Other Ambulatory Visit (HOSPITAL_COMMUNITY): Payer: Self-pay

## 2024-06-17 ENCOUNTER — Other Ambulatory Visit (HOSPITAL_COMMUNITY): Payer: Self-pay

## 2024-06-21 ENCOUNTER — Other Ambulatory Visit (HOSPITAL_BASED_OUTPATIENT_CLINIC_OR_DEPARTMENT_OTHER): Payer: Self-pay

## 2024-06-22 ENCOUNTER — Encounter (HOSPITAL_BASED_OUTPATIENT_CLINIC_OR_DEPARTMENT_OTHER): Payer: Self-pay | Admitting: Internal Medicine

## 2024-06-23 ENCOUNTER — Other Ambulatory Visit (HOSPITAL_BASED_OUTPATIENT_CLINIC_OR_DEPARTMENT_OTHER): Payer: Self-pay

## 2024-06-23 ENCOUNTER — Other Ambulatory Visit (HOSPITAL_COMMUNITY): Payer: Self-pay

## 2024-06-24 ENCOUNTER — Other Ambulatory Visit (HOSPITAL_COMMUNITY): Payer: Self-pay

## 2024-06-25 NOTE — Telephone Encounter (Signed)
 Pharmacy Patient Advocate Encounter  Received notification from [employer] that Prior Authorization for repatha has been DENIED.  Full denial letter will be uploaded to the media tab. See denial reason below.   PA #/Case ID/Reference #: [id number]

## 2024-07-07 NOTE — Telephone Encounter (Signed)
 Tracey Vinie BROCKS, MD to Me  (Selected Message)     07/07/24  1:28 PM Repatha  denied due to need for LDL to be >190.  LDL 110, goal <70 - elevated LP(a) - would add zetia 10 mg daily to rosuvastatin, check NMR in 3 months.   Dr. Mona

## 2024-07-13 NOTE — Telephone Encounter (Signed)
 Patient has been notified of Dr. Mona recommendations for zetia. See MyChart communication in encounter.
